# Patient Record
Sex: Female | Born: 1965 | Race: Black or African American | Hispanic: No | Smoking: Former smoker
Health system: Southern US, Community
[De-identification: ages and names within clinical notes are randomized; demographics above are authoritative.]

## PROBLEM LIST (undated history)

## (undated) DIAGNOSIS — K219 Gastro-esophageal reflux disease without esophagitis: Secondary | ICD-10-CM

## (undated) DIAGNOSIS — E079 Disorder of thyroid, unspecified: Secondary | ICD-10-CM

## (undated) DIAGNOSIS — I10 Essential (primary) hypertension: Secondary | ICD-10-CM

## (undated) DIAGNOSIS — E7800 Pure hypercholesterolemia, unspecified: Secondary | ICD-10-CM

## (undated) HISTORY — PX: TUBAL LIGATION: SHX77

## (undated) HISTORY — DX: Gastro-esophageal reflux disease without esophagitis: K21.9

## (undated) HISTORY — PX: DIAGNOSTIC LAPAROSCOPY: SUR761

---

## 2005-06-27 ENCOUNTER — Ambulatory Visit: Payer: Self-pay | Admitting: Family Medicine

## 2005-10-27 ENCOUNTER — Ambulatory Visit: Payer: Self-pay | Admitting: Family Medicine

## 2005-11-17 ENCOUNTER — Ambulatory Visit: Payer: Self-pay | Admitting: Family Medicine

## 2010-02-05 ENCOUNTER — Emergency Department (HOSPITAL_COMMUNITY): Admission: EM | Admit: 2010-02-05 | Discharge: 2010-02-05 | Payer: Self-pay | Admitting: Emergency Medicine

## 2010-10-28 ENCOUNTER — Ambulatory Visit (HOSPITAL_COMMUNITY)
Admission: RE | Admit: 2010-10-28 | Discharge: 2010-10-28 | Payer: Self-pay | Source: Home / Self Care | Attending: Obstetrics and Gynecology | Admitting: Obstetrics and Gynecology

## 2010-11-04 LAB — CBC
HCT: 37.1 % (ref 36.0–46.0)
Hemoglobin: 12.2 g/dL (ref 12.0–15.0)
MCH: 25.7 pg — ABNORMAL LOW (ref 26.0–34.0)
MCHC: 32.9 g/dL (ref 30.0–36.0)
MCV: 78.3 fL (ref 78.0–100.0)
Platelets: 315 10*3/uL (ref 150–400)
RBC: 4.74 MIL/uL (ref 3.87–5.11)
RDW: 13.8 % (ref 11.5–15.5)
WBC: 7.2 10*3/uL (ref 4.0–10.5)

## 2010-11-04 LAB — PREGNANCY, URINE: Preg Test, Ur: NEGATIVE

## 2010-11-11 NOTE — Op Note (Signed)
  NAMEALYRICA, Alexandria [name]            ACCOUNT NO.:  [id number]  MEDICAL RECORD NO.:  [id number]          PATIENT TYPE:  AMB  LOCATION:  SDC                           FACILITY:  [facility]  PHYSICIAN:  Avalynne Diver A. Shayley Medlin, M.D. DATE OF BIRTH:  11-03-1965  DATE OF PROCEDURE:  10/28/2010 DATE OF DISCHARGE:  10/28/2010                              OPERATIVE REPORT   PREOPERATIVE DIAGNOSIS:  Menometrorrhagia.  POSTOPERATIVE DIAGNOSIS:  Menometrorrhagia.  PROCEDURES:  D and C, hysteroscopy, ThermaChoice endometrial ablation.  SURGEON:  Afsa Meany A. Madeline Bebout, MD  ASSISTANT:  None.  ANESTHESIA:  General and local.  FINDINGS:  Small cavity, both ostia visualized.  No polyps or submucosal fibroids.  Endometrial curettings were sent to Pathology.  ESTIMATED BLOOD LOSS:  There was minimal estimated blood loss.  COMPLICATIONS:  None.  The patient to PACU in stable condition.  PROCEDURE IN DETAIL:  The patient taken to the operating room where she was given general anesthesia, placed in dorsal lithotomy position and prepped and draped in normal sterile fashion.  In-and-out catheter was used to drain the bladder.  A bivalve speculum was placed into the vagina.  The anterior lip of the cervix was grasped with single-tooth tenaculum.  A 20 mL of 1% Xylocaine was used for cervical block.  Cervix was dilated with Shawnie Pons dilators.  Patient kept pushing the speculum out with her breathing.  So we removed the speculum and used a weighted speculum in the vagina.  The hysteroscope was placed into the cavity. They were seen to be very small.  Both ostia were visualized with no submucosal fibroids or polyps seen.  The hysteroscope was then removed from the cavity.  A sharp curettage was done until a gritty texture was noted.  The ThermaChoice was done per protocol.  We did have to try and redo the ThermaChoice about 2-3 times secondary to overheating, on the third time, though it did not work, I did put in the  hysteroscope after the ablation and 99% of the fundus had been ablated without difficulty.  There was no areas that looked abnormal and no sign of perforation.  The deficit was 120 mL.  All instruments were correct. Sponge, lap, and needle counts were correct.  The patient went to recovery room in stable condition.     Raymon Schlarb A. Normand Sloop, M.D.     NAD/MEDQ  D:  10/28/2010  T:  10/29/2010  Job:  [id number]  Electronically Signed by Jaymes Graff M.D. on 11/11/2010 03:01:06 PM

## 2010-11-11 NOTE — H&P (Signed)
Alexandria Alexandria [name], Alexandria [name]            ACCOUNT NO.:  [id number]  MEDICAL RECORD NO.:  [id number]          PATIENT TYPE:  AMB  LOCATION:  SDC                           [facility]ILITY:  [facility]  PHYSICIAN:  Alexandria Alexandria [name], M.D. DATE OF BIRTH:  [birth year]  DATE OF ADMISSION: DATE OF DISCHARGE:                             HISTORY & PHYSICAL   DIAGNOSIS:  Menometrorrhagia.  HISTORY OF PRESENT ILLNESS:  The patient is a 45 year old African American female who presented to me in October 2011 complained of irregular cycles for a year, stated that she would get her menses 2 times a month every 14-20 days and they may last for 7 days with some spotting in between.  She would change a pad every hour, denied any chest pain or shortness of breath.  No bleeding disorders.  She did have some mild right lower quadrant pain with her menses.  Aleve took the pain, from a 7-8/10 to 0.  The patient had an ultrasound which was significant for uterus measured 8.66 x 5.57 x 5.64 and had a posterior fibroid measuring 6.31 x 5.95 with normal right ovary, left ovary had a simple cyst measuring 3.79 cm.  The patient then had a sonohysterogram which showed no masses and endometrial width of 2.46 and a length of 4.71.  Her free T3 and T4 were within normal limits.  Her hemoglobin is 12.6.  All treatments were reviewed with the patient for irregular vaginal bleeding, and she wanted to proceed with a D and C hysteroscopy and endometrial ablation.  PAST MEDICAL HISTORY:  As above.  PAST SURGICAL HISTORY:  Significant for tubal ligation.  She is currently not on any medications.  ALLERGIES:  She has no known drug allergies.  SOCIAL HISTORY:  She does smoke about a pack a day.  No alcohol or illicit drug use.  PHYSICAL EXAMINATION:  VITAL SIGNS:  The patient is 156 pounds.  She is 5 feet 8-1/2 inch, pulse is 76, blood pressure is 140/90. HEENT:  Pupils are equal.  Hearing is normal.  Throat is clear. NECK:  Thyroid is  not enlarged. HEART:  Regular rate and rhythm. LUNGS:  Clear to auscultation bilaterally. SKIN:  It has no masses, discharge, skin change, or nipple retraction bilaterally. BACK:  No CVA tenderness. ABDOMEN:  Nontender without any masses or organomegaly. MUSCULOSKELETAL:  No clubbing, cyanosis, or edema. NEUROLOGICAL:  Within normal limits. PELVIC:  Full vaginal exam within normal limits.  Cervix is nontender without any lesions.  Uterus is irregular, about 10-week size, mobile, nontender.  Adnexa has no masses.  Urine pregnancy test was negative. Her TSH was slightly elevated but free T3 and T4 were within normal limits.  REVIEW OF SYSTEMS:  HEMATOLOGY:  No bleeding disorder.  GENITOURINARY: Significant for irregular vaginal bleeding.  MUSCULOSKELETAL:  No weakness.  CARDIOVASCULAR:  No heart palpitations.  ASSESSMENT:  Irregular vaginal bleeding.  All treatments were reviewed with the patient, the hormone, uterine artery embolization, myomectomy, ablation, D and C with and without ablation, hysterectomy, and observation.  Patient decided D and C hysteroscopy, endometrial ablation.  She understands the risks are but not limited to bleeding,  infection, damage to internal organs such as bowel, bladder, and major blood vessels.     Alexandria Alexandria [name], M.D.     NAD/MEDQ  D:  10/27/2010  T:  10/28/2010  Job:  [id number]  Electronically Signed by Alexandria Alexandria [name] M.D. on 11/11/2010 03:00:59 PM

## 2011-01-07 LAB — POCT I-STAT, CHEM 8
BUN: 10 mg/dL (ref 6–23)
Calcium, Ion: 1.07 mmol/L — ABNORMAL LOW (ref 1.12–1.32)
Chloride: 106 mEq/L (ref 96–112)
Creatinine, Ser: 0.8 mg/dL (ref 0.4–1.2)
Glucose, Bld: 79 mg/dL (ref 70–99)
HCT: 41 % (ref 36.0–46.0)
Hemoglobin: 13.9 g/dL (ref 12.0–15.0)
Potassium: 3.4 mEq/L — ABNORMAL LOW (ref 3.5–5.1)
Sodium: 140 mEq/L (ref 135–145)
TCO2: 25 mmol/L (ref 0–100)

## 2011-01-07 LAB — POCT CARDIAC MARKERS
CKMB, poc: 1.1 ng/mL (ref 1.0–8.0)
Myoglobin, poc: 51.8 ng/mL (ref 12–200)
Troponin i, poc: <0.05 ng/mL (ref 0.00–0.09)

## 2011-10-27 ENCOUNTER — Other Ambulatory Visit: Payer: Self-pay | Admitting: Obstetrics and Gynecology

## 2011-11-11 ENCOUNTER — Encounter (HOSPITAL_COMMUNITY): Payer: Self-pay | Admitting: Pharmacist

## 2011-11-18 ENCOUNTER — Encounter (HOSPITAL_COMMUNITY)
Admission: RE | Admit: 2011-11-18 | Discharge: 2011-11-18 | Disposition: A | Discharge status: Home / Self Care | Payer: BC Managed Care – PPO | Source: Ambulatory Visit | Attending: Obstetrics and Gynecology | Admitting: Obstetrics and Gynecology

## 2011-11-18 ENCOUNTER — Encounter (HOSPITAL_COMMUNITY): Payer: Self-pay

## 2011-11-18 LAB — CBC
HCT: 42.7 % (ref 36.0–46.0)
Hemoglobin: 14 g/dL (ref 12.0–15.0)
MCH: 26.6 pg (ref 26.0–34.0)
MCHC: 32.8 g/dL (ref 30.0–36.0)
MCV: 81 fL (ref 78.0–100.0)
Platelets: 262 10*3/uL (ref 150–400)
RBC: 5.27 MIL/uL — ABNORMAL HIGH (ref 3.87–5.11)
RDW: 13.2 % (ref 11.5–15.5)
WBC: 6.5 10*3/uL (ref 4.0–10.5)

## 2011-11-18 LAB — SURGICAL PCR SCREEN
MRSA, PCR: NEGATIVE
Staphylococcus aureus: NEGATIVE

## 2011-11-18 NOTE — Patient Instructions (Addendum)
20 GLENYCE RANDLE  11/18/2011   Your procedure is scheduled on:  11/25/11  Enter through the Main Entrance of [facility] at 8 AM.  Pick up the phone at the desk and dial [phone].   Call this number if you have problems the morning of surgery: [phone]   Remember:   Do not eat food:After Midnight.  Do not drink clear liquids: After Midnight.  Take these medicines the morning of surgery with A SIP OF WATER: NA   Do not wear jewelry, make-up or nail polish.  Do not wear lotions, powders, or perfumes. You may wear deodorant.  Do not shave 48 hours prior to surgery.  Do not bring valuables to the hospital.  Contacts, dentures or bridgework may not be worn into surgery.  Leave suitcase in the car. After surgery it may be brought to your room.  For patients admitted to the hospital, checkout time is 11:00 AM the day of discharge.   Patients discharged the day of surgery will not be allowed to drive home.  Name and phone number of your driver: NA  Special Instructions: CHG Shower Use Special Wash: 1/2 bottle night before surgery and 1/2 bottle morning of surgery.   Please read over the following fact sheets that you were given: MRSA Information

## 2011-11-22 NOTE — H&P (Signed)
NAMECAMBRIE, Alexandria Little            ACCOUNT NO.:  [id number]  MEDICAL RECORD NO.:  [id number]  LOCATION:  SDC                           [facility]ACILITY:  [facility]  PHYSICIAN:  Naima A. Dillard, M.D. DATE OF BIRTH:  14-Apr-1966  DATE OF ADMISSION:  11/18/2011 DATE OF DISCHARGE:  11/18/2011                             HISTORY & PHYSICAL   HISTORY OF PRESENT ILLNESS:  Ms. Alexandria Little is a 46 year old single black female, para 2-0-1-2, presenting for a total laparoscopic hysterectomy and cystoscopy because of symptomatic uterine fibroids and chronic pelvic pain.  Two years ago, the patient complained of irregular vaginal bleeding that would occur every 14-20 days and last for approximately 7 days.  Additionally, she would experience intermenstrual bleeding along with severe cramping.  In January 2012, the patient underwent Thermachoice endometrial ablation that improved her bleeding pattern. However, the patient states that 1 week before and 2 days after her 4 day menstrual flow, she will experience severe cramping that she rates as a 10/10 on 10-point pain scale.  The patient's pain will cause her to double over.  It is described as being very sharp and being more intense on the right as opposed to the left portion of her pelvic region. Fortunately, the patient is able to find relief by taking naproxen. When she has her menstrual flow, she changes a pad every 2-3 hours and denies any intermenstrual bleeding or actual cramping during her period, any dyspareunia, urinary tract symptoms, changes in her bowel movements or vaginitis symptoms.  An pelvic ultrasound in December 2012, showed a uterus measuring 9.08 x 7.20 x 5.92 cm with a posterior submucosal fibroid (dimensions not mentioned) a normal right ovary and a simple left ovarian cyst measuring 3.6 x 2.7 x 2.3 cm.  There was no free fluid in the cul-de-sac and the left ovary itself measured 4.31 x 2.85 x 2.64 cm.  A review of both medical and surgical  management options were given to the patient, however, she desires to proceed with definitive therapy in the form of hysterectomy.  PAST MEDICAL HISTORY/OBSTETRIC HISTORY:  Gravida 3, para 2-0-1-2.  Both of the patient's deliveries were vaginal.  The largest weighing 6 pounds, 0 ounces, and she experienced gestational diabetes.  GYNECOLOGIC HISTORY:  Menarche at 46 years old.  Last menstrual period November 07, 2011.  She uses tubal ligation as her method of contraception.  Denies any history of sexually transmitted diseases or abnormal Pap smears.  MEDICAL HISTORY:  Negative.  SURGICAL HISTORY:  In 2002, bilateral tubal ligation, in 2012 Thermachoice endometrial ablation.  Denies any history of blood transfusions or problems with anesthesia.  FAMILY HISTORY:  Cardiovascular disease, hypertension and stroke.  SOCIAL HISTORY:  The patient is single and she works for [employer] as a Location manager.  HABITS:  She smokes 1 pack of cigarettes per day.  Denies any illicit drug use or alcohol consumption.  CURRENT MEDICATIONS:  None.  ALLERGIES:  She has no known drug allergies.  She further denies any sensitivities to soy, latex, peanuts or shellfish.  REVIEW OF SYSTEMS:  The patient wears glasses.  She has a partial plate in her bottom jaw.  Denies any headache with blurred vision, chest  pain, shortness of breath, nausea, vomiting, diarrhea, difficulty swallowing, urinary urgency, frequency, hesitancy or dysuria, any bright red blood per rectum, problems with constipation, back pain, myalgias, arthralgias, skin rashes and except as is mentioned in history of present illness, the patient's review of systems is otherwise negative.  PHYSICAL EXAMINATION:  VITAL SIGNS:  Blood pressure 122/80, pulse is 72, respiration 14, temperature 98.8 degrees Fahrenheit orally, weight is 161 pounds, height 5 feet, 1.5 inches tall, body mass index is 31. NECK:  Supple without masses.  There  is no thyromegaly or cervical adenopathy. HEART:  Regular rate and rhythm. LUNGS:  Clear. BACK:  No CVA tenderness. ABDOMEN:  No tenderness, guarding, rebound, palpable masses or organomegaly. EXTREMITIES:  No clubbing, cyanosis or edema. PELVIC:  EG/BUS is normal.  Vagina is normal.  Cervix is nontender without lesions.  Uterus appears 8-10 week size.  It is without tenderness.  Adnexa without tenderness or masses.  IMPRESSION: 1. Fibroid uterus 2. Pelvic pain.  DISPOSITION:  A discussion was held with the patient regarding the indications for her procedure along with its risks, which include, but are not limited to, reaction to anesthesia, damage to adjacent organs, infection, excessive bleeding and the possibility of the need for an open abdominal incision.  The patient further understands that her pain may not be relieved by this procedure and she is to complete a MiraLAX bowel prep 24 hours prior to surgery.  The patient verbalized understanding of these risks and has consented to proceed with a total laparoscopic hysterectomy with the possibility of a total abdominal Hysterectomy, or  possible laparoscopically-assisted vaginal hysterectomy Followed by cystoscopy at [facility] [place] on November 25, 2011, at 9:30 a.m.     Elexus Barman J. Lowell Guitar, P.A.-C   ______________________________ Pierre Bali Normand Sloop, M.D.    EJP/MEDQ  D:  11/21/2011  T:  11/22/2011  Job:  [id number]

## 2011-11-25 ENCOUNTER — Other Ambulatory Visit: Payer: Self-pay | Admitting: Obstetrics and Gynecology

## 2011-11-25 ENCOUNTER — Ambulatory Visit (HOSPITAL_COMMUNITY): Payer: BC Managed Care – PPO | Admitting: Anesthesiology

## 2011-11-25 ENCOUNTER — Encounter (HOSPITAL_COMMUNITY): Payer: Self-pay | Admitting: *Deleted

## 2011-11-25 ENCOUNTER — Encounter (HOSPITAL_COMMUNITY)
Admission: RE | Disposition: A | Discharge status: Home / Self Care | Payer: Self-pay | Source: Ambulatory Visit | Attending: Obstetrics and Gynecology

## 2011-11-25 ENCOUNTER — Encounter (HOSPITAL_COMMUNITY): Payer: Self-pay | Admitting: Anesthesiology

## 2011-11-25 ENCOUNTER — Ambulatory Visit (HOSPITAL_COMMUNITY)
Admission: RE | Admit: 2011-11-25 | Discharge: 2011-11-26 | Disposition: A | Discharge status: Home / Self Care | Payer: BC Managed Care – PPO | Source: Ambulatory Visit | Attending: Obstetrics and Gynecology | Admitting: Obstetrics and Gynecology

## 2011-11-25 DIAGNOSIS — Z90710 Acquired absence of both cervix and uterus: Secondary | ICD-10-CM

## 2011-11-25 DIAGNOSIS — Z01818 Encounter for other preprocedural examination: Secondary | ICD-10-CM | POA: Insufficient documentation

## 2011-11-25 DIAGNOSIS — D251 Intramural leiomyoma of uterus: Secondary | ICD-10-CM | POA: Insufficient documentation

## 2011-11-25 DIAGNOSIS — N949 Unspecified condition associated with female genital organs and menstrual cycle: Secondary | ICD-10-CM | POA: Insufficient documentation

## 2011-11-25 DIAGNOSIS — Z01812 Encounter for preprocedural laboratory examination: Secondary | ICD-10-CM | POA: Insufficient documentation

## 2011-11-25 DIAGNOSIS — D219 Benign neoplasm of connective and other soft tissue, unspecified: Secondary | ICD-10-CM

## 2011-11-25 HISTORY — PX: CYSTOSCOPY: SHX5120

## 2011-11-25 HISTORY — PX: LAPAROSCOPIC HYSTERECTOMY: SHX1926

## 2011-11-25 LAB — PREGNANCY, URINE: Preg Test, Ur: NEGATIVE

## 2011-11-25 SURGERY — HYSTERECTOMY, TOTAL, LAPAROSCOPIC
Anesthesia: General | Site: Abdomen | Wound class: Clean Contaminated

## 2011-11-25 MED ORDER — ESTRADIOL 0.1 MG/GM VA CREA
TOPICAL_CREAM | VAGINAL | Status: AC
  Filled 2011-11-25: qty 42.5

## 2011-11-25 MED ORDER — NALOXONE HCL 0.4 MG/ML IJ SOLN: 0.4000 mg | INTRAMUSCULAR | Status: DC | PRN

## 2011-11-25 MED ORDER — HYDROMORPHONE HCL PF 1 MG/ML IJ SOLN
INTRAMUSCULAR | Status: AC
  Filled 2011-11-25: qty 1

## 2011-11-25 MED ORDER — GLYCOPYRROLATE 0.2 MG/ML IJ SOLN
INTRAMUSCULAR | Status: AC
  Filled 2011-11-25: qty 2

## 2011-11-25 MED ORDER — MIDAZOLAM HCL 2 MG/2ML IJ SOLN
INTRAMUSCULAR | Status: AC
  Filled 2011-11-25: qty 2

## 2011-11-25 MED ORDER — HYDROMORPHONE HCL PF 1 MG/ML IJ SOLN
INTRAMUSCULAR | Status: DC | PRN
  Administered 2011-11-25: 1 mg via INTRAVENOUS

## 2011-11-25 MED ORDER — INDIGOTINDISULFONATE SODIUM 8 MG/ML IJ SOLN
INTRAMUSCULAR | Status: AC
  Filled 2011-11-25: qty 5

## 2011-11-25 MED ORDER — ONDANSETRON HCL 4 MG/2ML IJ SOLN
INTRAMUSCULAR | Status: DC | PRN
  Administered 2011-11-25: 4 mg via INTRAVENOUS

## 2011-11-25 MED ORDER — PROPOFOL 10 MG/ML IV EMUL
INTRAVENOUS | Status: DC | PRN
  Administered 2011-11-25: 50 mg via INTRAVENOUS
  Administered 2011-11-25: 150 mg via INTRAVENOUS

## 2011-11-25 MED ORDER — MENTHOL 3 MG MT LOZG: 1.0000 | LOZENGE | OROMUCOSAL | Status: DC | PRN

## 2011-11-25 MED ORDER — DIPHENHYDRAMINE HCL 12.5 MG/5ML PO ELIX: 12.5000 mg | ORAL_SOLUTION | Freq: Four times a day (QID) | ORAL | Status: DC | PRN

## 2011-11-25 MED ORDER — DEXAMETHASONE SODIUM PHOSPHATE 10 MG/ML IJ SOLN
INTRAMUSCULAR | Status: DC | PRN
  Administered 2011-11-25: 10 mg via INTRAVENOUS

## 2011-11-25 MED ORDER — 0.9 % SODIUM CHLORIDE (POUR BTL) OPTIME
TOPICAL | Status: DC | PRN
  Administered 2011-11-25: 1000 mL

## 2011-11-25 MED ORDER — NEOSTIGMINE METHYLSULFATE 1 MG/ML IJ SOLN
INTRAMUSCULAR | Status: DC | PRN
  Administered 2011-11-25: 3 mg via INTRAVENOUS

## 2011-11-25 MED ORDER — HYDROCODONE-ACETAMINOPHEN 5-325 MG PO TABS: 1.0000 | ORAL_TABLET | ORAL | Status: DC | PRN

## 2011-11-25 MED ORDER — ACETAMINOPHEN 325 MG PO TABS: 325.0000 mg | ORAL_TABLET | ORAL | Status: DC | PRN

## 2011-11-25 MED ORDER — PROPOFOL 10 MG/ML IV EMUL
INTRAVENOUS | Status: AC
  Filled 2011-11-25: qty 20

## 2011-11-25 MED ORDER — NEOSTIGMINE METHYLSULFATE 1 MG/ML IJ SOLN
INTRAMUSCULAR | Status: AC
  Filled 2011-11-25: qty 10

## 2011-11-25 MED ORDER — CEFAZOLIN SODIUM 1-5 GM-% IV SOLN
1.0000 g | INTRAVENOUS | Status: AC
  Administered 2011-11-25: 1 g via INTRAVENOUS

## 2011-11-25 MED ORDER — IBUPROFEN 600 MG PO TABS
600.0000 mg | ORAL_TABLET | Freq: Four times a day (QID) | ORAL | Status: DC | PRN
  Administered 2011-11-26: 600 mg via ORAL
  Filled 2011-11-25: qty 1

## 2011-11-25 MED ORDER — SODIUM CHLORIDE 0.9 % IJ SOLN: 9.0000 mL | INTRAMUSCULAR | Status: DC | PRN

## 2011-11-25 MED ORDER — LACTATED RINGERS IR SOLN
Status: DC | PRN
  Administered 2011-11-25: 3000 mL

## 2011-11-25 MED ORDER — ONDANSETRON HCL 4 MG/2ML IJ SOLN: 4.0000 mg | Freq: Four times a day (QID) | INTRAMUSCULAR | Status: DC | PRN

## 2011-11-25 MED ORDER — LIDOCAINE HCL (CARDIAC) 20 MG/ML IV SOLN
INTRAVENOUS | Status: DC | PRN
  Administered 2011-11-25: 50 mg via INTRAVENOUS

## 2011-11-25 MED ORDER — LIDOCAINE HCL (CARDIAC) 20 MG/ML IV SOLN
INTRAVENOUS | Status: AC
  Filled 2011-11-25: qty 5

## 2011-11-25 MED ORDER — INDIGOTINDISULFONATE SODIUM 8 MG/ML IJ SOLN
INTRAMUSCULAR | Status: DC | PRN
  Administered 2011-11-25: 5 mL via INTRAVENOUS

## 2011-11-25 MED ORDER — PROMETHAZINE HCL 25 MG/ML IJ SOLN: 6.2500 mg | INTRAMUSCULAR | Status: DC | PRN

## 2011-11-25 MED ORDER — HYDROMORPHONE 0.3 MG/ML IV SOLN
INTRAVENOUS | Status: DC
  Administered 2011-11-25: 16:00:00 via INTRAVENOUS
  Administered 2011-11-25: 0.3 mg via INTRAVENOUS

## 2011-11-25 MED ORDER — ROCURONIUM BROMIDE 50 MG/5ML IV SOLN
INTRAVENOUS | Status: AC
  Filled 2011-11-25: qty 1

## 2011-11-25 MED ORDER — KETOROLAC TROMETHAMINE 30 MG/ML IJ SOLN
INTRAMUSCULAR | Status: DC | PRN
  Administered 2011-11-25: 30 mg via INTRAVENOUS

## 2011-11-25 MED ORDER — KETOROLAC TROMETHAMINE 30 MG/ML IJ SOLN
30.0000 mg | Freq: Four times a day (QID) | INTRAMUSCULAR | Status: DC
  Administered 2011-11-25 – 2011-11-26 (×3): 30 mg via INTRAVENOUS
  Filled 2011-11-25 (×3): qty 1

## 2011-11-25 MED ORDER — ROCURONIUM BROMIDE 100 MG/10ML IV SOLN
INTRAVENOUS | Status: DC | PRN
  Administered 2011-11-25: 30 mg via INTRAVENOUS
  Administered 2011-11-25 (×3): 10 mg via INTRAVENOUS

## 2011-11-25 MED ORDER — GLYCOPYRROLATE 0.2 MG/ML IJ SOLN
INTRAMUSCULAR | Status: AC
  Filled 2011-11-25: qty 3

## 2011-11-25 MED ORDER — PROMETHAZINE HCL 25 MG/ML IJ SOLN: 12.5000 mg | INTRAMUSCULAR | Status: DC | PRN

## 2011-11-25 MED ORDER — GLYCOPYRROLATE 0.2 MG/ML IJ SOLN
INTRAMUSCULAR | Status: DC | PRN
  Administered 2011-11-25: .6 mg via INTRAVENOUS

## 2011-11-25 MED ORDER — STERILE WATER FOR IRRIGATION IR SOLN
Status: DC | PRN
  Administered 2011-11-25: 1000 mL via INTRAVESICAL

## 2011-11-25 MED ORDER — BUPIVACAINE HCL (PF) 0.25 % IJ SOLN
INTRAMUSCULAR | Status: AC
  Filled 2011-11-25: qty 30

## 2011-11-25 MED ORDER — DIPHENHYDRAMINE HCL 50 MG/ML IJ SOLN: 12.5000 mg | Freq: Four times a day (QID) | INTRAMUSCULAR | Status: DC | PRN

## 2011-11-25 MED ORDER — ZOLPIDEM TARTRATE 5 MG PO TABS: 5.0000 mg | ORAL_TABLET | Freq: Every evening | ORAL | Status: DC | PRN

## 2011-11-25 MED ORDER — DEXAMETHASONE SODIUM PHOSPHATE 10 MG/ML IJ SOLN
INTRAMUSCULAR | Status: AC
  Filled 2011-11-25: qty 1

## 2011-11-25 MED ORDER — FENTANYL CITRATE 0.05 MG/ML IJ SOLN
INTRAMUSCULAR | Status: AC
  Filled 2011-11-25: qty 5

## 2011-11-25 MED ORDER — HYDROMORPHONE 0.3 MG/ML IV SOLN
INTRAVENOUS | Status: AC
  Filled 2011-11-25: qty 25

## 2011-11-25 MED ORDER — FENTANYL CITRATE 0.05 MG/ML IJ SOLN
INTRAMUSCULAR | Status: DC | PRN
  Administered 2011-11-25: 100 ug via INTRAVENOUS
  Administered 2011-11-25: 150 ug via INTRAVENOUS
  Administered 2011-11-25 (×3): 50 ug via INTRAVENOUS
  Administered 2011-11-25: 100 ug via INTRAVENOUS

## 2011-11-25 MED ORDER — FENTANYL CITRATE 0.05 MG/ML IJ SOLN: 25.0000 ug | INTRAMUSCULAR | Status: DC | PRN

## 2011-11-25 MED ORDER — MIDAZOLAM HCL 5 MG/5ML IJ SOLN
INTRAMUSCULAR | Status: DC | PRN
  Administered 2011-11-25: 2 mg via INTRAVENOUS

## 2011-11-25 MED ORDER — KETOROLAC TROMETHAMINE 30 MG/ML IJ SOLN
INTRAMUSCULAR | Status: AC
  Filled 2011-11-25: qty 1

## 2011-11-25 MED ORDER — KETOROLAC TROMETHAMINE 30 MG/ML IJ SOLN: 30.0000 mg | Freq: Four times a day (QID) | INTRAMUSCULAR | Status: DC

## 2011-11-25 MED ORDER — ONDANSETRON HCL 4 MG/2ML IJ SOLN
INTRAMUSCULAR | Status: AC
  Filled 2011-11-25: qty 2

## 2011-11-25 MED ORDER — ESTRADIOL 0.1 MG/GM VA CREA
TOPICAL_CREAM | VAGINAL | Status: DC | PRN
  Administered 2011-11-25: 1 via VAGINAL

## 2011-11-25 MED ORDER — LACTATED RINGERS IV SOLN
INTRAVENOUS | Status: DC
  Administered 2011-11-25 (×3): via INTRAVENOUS
  Administered 2011-11-25: 125 mL/h via INTRAVENOUS

## 2011-11-25 MED ORDER — BUPIVACAINE HCL (PF) 0.25 % IJ SOLN
INTRAMUSCULAR | Status: DC | PRN
  Administered 2011-11-25: 15 mL

## 2011-11-25 MED ORDER — KETOROLAC TROMETHAMINE 30 MG/ML IJ SOLN: 15.0000 mg | Freq: Once | INTRAMUSCULAR | Status: DC | PRN

## 2011-11-25 SURGICAL SUPPLY — 49 items
BARRIER ADHS 3X4 INTERCEED (GAUZE/BANDAGES/DRESSINGS) IMPLANT
CHLORAPREP W/TINT 26ML (MISCELLANEOUS) ×3 IMPLANT
CLOTH BEACON ORANGE TIMEOUT ST (SAFETY) ×3 IMPLANT
COVER MAYO STAND STRL (DRAPES) ×3 IMPLANT
DERMABOND ADVANCED (GAUZE/BANDAGES/DRESSINGS) ×1
DERMABOND ADVANCED .7 DNX12 (GAUZE/BANDAGES/DRESSINGS) ×2 IMPLANT
DISSECTOR BLUNT TIP ENDO 5MM (MISCELLANEOUS) IMPLANT
DRAPE CAMERA CLOSED 9X96 (DRAPES) IMPLANT
EVACUATOR SMOKE 8.L (FILTER) ×3 IMPLANT
GAUZE PACKING 1 X5 YD ST (GAUZE/BANDAGES/DRESSINGS) ×3 IMPLANT
GLOVE BIO SURGEON STRL SZ 6.5 (GLOVE) ×21 IMPLANT
GLOVE BIO SURGEON STRL SZ7.5 (GLOVE) ×9 IMPLANT
GLOVE BIOGEL PI IND STRL 7.0 (GLOVE) ×8 IMPLANT
GLOVE BIOGEL PI IND STRL 7.5 (GLOVE) ×4 IMPLANT
GLOVE BIOGEL PI INDICATOR 7.0 (GLOVE) ×4
GLOVE BIOGEL PI INDICATOR 7.5 (GLOVE) ×2
GLOVE ECLIPSE 6.5 STRL STRAW (GLOVE) ×12 IMPLANT
GLOVE SURG SS PI 7.0 STRL IVOR (GLOVE) ×3 IMPLANT
GOWN PREVENTION PLUS LG XLONG (DISPOSABLE) ×3 IMPLANT
GOWN STRL REIN XL XLG (GOWN DISPOSABLE) ×6 IMPLANT
HEMOSTAT SURGICEL 2X14 (HEMOSTASIS) ×3 IMPLANT
NS IRRIG 1000ML POUR BTL (IV SOLUTION) ×3 IMPLANT
OCCLUDER COLPOPNEUMO (BALLOONS) ×3 IMPLANT
PACK LAPAROSCOPY BASIN (CUSTOM PROCEDURE TRAY) ×3 IMPLANT
SCALPEL HARMONIC ACE (MISCELLANEOUS) ×3 IMPLANT
SCISSORS LAP 5X35 DISP (ENDOMECHANICALS) IMPLANT
SET CYSTO W/LG BORE CLAMP LF (SET/KITS/TRAYS/PACK) IMPLANT
SET IRRIG TUBING LAPAROSCOPIC (IRRIGATION / IRRIGATOR) ×3 IMPLANT
SLEEVE Z-THREAD 5X100MM (TROCAR) ×3 IMPLANT
SOLUTION ELECTROLUBE (MISCELLANEOUS) ×3 IMPLANT
SPONGE LAP 18X18 X RAY DECT (DISPOSABLE) ×3 IMPLANT
STRIP CLOSURE SKIN 1/4X4 (GAUZE/BANDAGES/DRESSINGS) IMPLANT
SUT MNCRL AB 3-0 PS2 27 (SUTURE) ×3 IMPLANT
SUT PDS AB 1 CT1 36 (SUTURE) ×21 IMPLANT
SUT VICRYL 0 TIES 12 18 (SUTURE) ×3 IMPLANT
SUT VICRYL 0 UR6 27IN ABS (SUTURE) ×6 IMPLANT
SYR 50ML LL SCALE MARK (SYRINGE) ×3 IMPLANT
TIP UTERINE 5.1X6CM LAV DISP (MISCELLANEOUS) IMPLANT
TIP UTERINE 6.7X10CM GRN DISP (MISCELLANEOUS) IMPLANT
TIP UTERINE 6.7X6CM WHT DISP (MISCELLANEOUS) IMPLANT
TIP UTERINE 6.7X8CM BLUE DISP (MISCELLANEOUS) ×3 IMPLANT
TOWEL OR 17X24 6PK STRL BLUE (TOWEL DISPOSABLE) ×6 IMPLANT
TRAY FOLEY CATH 14FR (SET/KITS/TRAYS/PACK) ×3 IMPLANT
TROCAR BALLN 12MMX100 BLUNT (TROCAR) ×3 IMPLANT
TROCAR Z-THREAD FIOS 11X100 BL (TROCAR) ×6 IMPLANT
TROCAR Z-THREAD FIOS 5X100MM (TROCAR) ×3 IMPLANT
TUBING FILTER THERMOFLATOR (ELECTROSURGICAL) ×3 IMPLANT
WARMER LAPAROSCOPE (MISCELLANEOUS) ×3 IMPLANT
WATER STERILE IRR 1000ML POUR (IV SOLUTION) ×3 IMPLANT

## 2011-11-25 NOTE — Op Note (Signed)
Preop Diagnosis: Symptomatic Fibroids, Pelvic Pain   Postop Diagnosis: Symptomatic Fibroids, Pelvic Pain   Procedure: HYSTERECTOMY TOTAL LAPAROSCOPIC CYSTOSCOPY and LOA  Anesthesia: General   Anesthesiologist: Velna Hatchet, MD   Attending: Michael Litter, MD   Assistant: Osborn Coho MD  Findings:10 week size uterus with fibroids.  Omental adhesions to the abdominal wall.  Normal appearing ovaries bilaterally  Pathology:uterus and cervix Fluids:  UOP:250 cc  EBL:175cc Complications:none  Procedure: The patient was taken to the operating room, placed under general anesthesia and prepped and draped in the normal sterile fashion. A Foley catheter was placed. The uterus sounded to 9cm. A weighted speculum and vaginal retractors were placed in the vagina. Tenaculum was placed on the anterior lip of the cervix. The uterus did sound to 8cm  A size 8cm tip was used and the rumi was placed, tip balloon and occluder insufflated. Attention was then turned to the abdomen. A 10 mm infraumbilical incision was made with the scalpel after 5 cc of 25% percent Marcaine was used for local anesthesia. The subcutaneous tissue was dissected and the fascia was incised with the knife. A purse string stitch was placed in the fascia and Hassan placed into the intra-abdominal cavity and anchored to the suture. Intraabdominal placement was confirmed with the laparoscope.  A 5 mm trochar was placed in the right  lower quadrant under direct visualization with the laparoscope. A 10 mm trochar was placed in the left lower quadrant under direct visualization.   The harmonic scalpel was used to cauterize and cut the uterine ovarian ligaments bilaterally.   Both round ligaments were cauterized and cut with the harmonic scalpel as well and the bladder flap created with the harmonic scalpel and removed away from the uterus. Both uterine arteries were cauterized and cut with harmonic scalpel.  The harmonic was used to  circumscribe the [facility] ring and free the uterus and cervix. The uterus was then pulled into the vagina. A 5mm trochar was placed in the mid abdominal quadrant.   Both angles were sutured with 1 PDS.  The  remainder of the cuff was sutured with 1 PDS using interrupted stitches until the vaginal cuff was closed.  A total of 6 sutures were used.   Irrigation was performed.  Gas was allowed to leave the abdomen to check for bleeders and all pedicles were seen to be hemostatic the patient was given indigo carmine.  Cystoscopy was performed and both ureters were seen to efflux indigo carmine without difficulty. The bladder had full integrity with no suture or laceration visualized.  The vagina was inspected and the cuff was noted to be intact.  Attention was then turned back to the abdomen after removing top pair of gloves. The abdomen was reinsufflated with CO2 gas.   The abdomen and pelvis was copiously irrigated and there was some oozing from the peritoneum in LLQ.  Surgicel was placed and   hemostasis was noted.  The 10 mm port on the patients left side was closed with 0 Vicryl using the fascial closure device.  All trochars were removed under direct visualization using the laparoscope.  The umbilical fascia was reapproximated by tying the circumferential suture. The two 10 mm incisions were closed with 3-0 Monocryl via a subcuticular stitch.  All remaining skin incisions were closed with Dermabond and the 10 mm skin incisions were reinforced using Dermabond.  Sponge lap and needle counts were correct. There was an interruption in the mucosa of the vagina.  This was reapproximated with an o vicryl figure of eight stitch.    The patient tolerated the procedure well and was returned to the PACU in stable condition

## 2011-11-25 NOTE — Progress Notes (Signed)
Subjective: Patient reports incisional pain, tolerating PO, + flatus, + BM and no problems voiding.    Objective: I have reviewed patient's vital signs, intake and output, medications, labs, microbiology, pathology and radiology results.  General: alert Resp: clear to auscultation bilaterally Cardio: regular rate and rhythm, S1, S2 normal, no murmur, click, rub or gallop GI: soft, non-tender; bowel sounds normal; no masses,  no organomegaly Extremities: extremities normal, atraumatic, no cyanosis or edema Vaginal Bleeding: minimal.  Scant old blood on the pad   Assessment/Plan: S/PTLH Cystoscopy.  Pt stable routine care  LOS: 0 days    Alexandria Little A 11/25/2011, 10:50 PM

## 2011-11-25 NOTE — Anesthesia Postprocedure Evaluation (Signed)
Anesthesia Post Note  Patient: Alexandria Little  Procedure(s) Performed:  HYSTERECTOMY TOTAL LAPAROSCOPIC; CYSTOSCOPY  Anesthesia type: General  Patient location: PACU  Post pain: Pain level controlled  Post assessment: Post-op Vital signs reviewed  Last Vitals:  Filed Vitals:   11/25/11 0836  BP: 152/93  Pulse: 72  Temp: 37 C  Resp: 18    Post vital signs: Reviewed  Level of consciousness: sedated  Complications: No apparent anesthesia complicationsfj

## 2011-11-25 NOTE — Anesthesia Preprocedure Evaluation (Addendum)
Anesthesia Evaluation  Patient identified by MRN, date of birth, ID band Patient awake    Reviewed: Allergy & Precautions, H&P , Patient's Chart, lab work & pertinent test results, reviewed documented beta blocker date and time   History of Anesthesia Complications Negative for: history of anesthetic complications  Airway Mallampati: II TM Distance: >3 FB Neck ROM: full    Dental No notable dental hx.    Pulmonary neg pulmonary ROS,  clear to auscultation  Pulmonary exam normal       Cardiovascular Exercise Tolerance: Good neg cardio ROS regular Normal    Neuro/Psych Negative Neurological ROS  Negative Psych ROS   GI/Hepatic negative GI ROS, Neg liver ROS,   Endo/Other  Negative Endocrine ROS  Renal/GU negative Renal ROS     Musculoskeletal   Abdominal   Peds  Hematology negative hematology ROS (+)   Anesthesia Other Findings   Reproductive/Obstetrics negative OB ROS                           Anesthesia Physical Anesthesia Plan  ASA: I  Anesthesia Plan: General ETT   Post-op Pain Management:    Induction:   Airway Management Planned:   Additional Equipment:   Intra-op Plan:   Post-operative Plan:   Informed Consent: I have reviewed the patients History and Physical, chart, labs and discussed the procedure including the risks, benefits and alternatives for the proposed anesthesia with the patient or authorized representative who has indicated his/her understanding and acceptance.   Dental Advisory Given  Plan Discussed with: CRNA, Surgeon and Anesthesiologist  Anesthesia Plan Comments:        Anesthesia Quick Evaluation

## 2011-11-25 NOTE — Transfer of Care (Signed)
Immediate Anesthesia Transfer of Care Note  Patient: Alexandria Little  Procedure(s) Performed:  HYSTERECTOMY TOTAL LAPAROSCOPIC; CYSTOSCOPY  Patient Location: PACU  Anesthesia Type: General  Level of Consciousness: alert   Airway & Oxygen Therapy: Patient Spontanous Breathing and Patient connected to nasal cannula oxygen  Post-op Assessment: Report given to PACU RN and Post -op Vital signs reviewed and stable  Post vital signs: stable  Complications: No apparent anesthesia complications

## 2011-11-26 ENCOUNTER — Encounter (HOSPITAL_COMMUNITY): Payer: Self-pay | Admitting: Obstetrics and Gynecology

## 2011-11-26 DIAGNOSIS — Z90710 Acquired absence of both cervix and uterus: Secondary | ICD-10-CM

## 2011-11-26 LAB — CBC
HCT: 36.6 % (ref 36.0–46.0)
Hemoglobin: 11.9 g/dL — ABNORMAL LOW (ref 12.0–15.0)
MCHC: 32.5 g/dL (ref 30.0–36.0)
MCV: 80.6 fL (ref 78.0–100.0)
RDW: 13.3 % (ref 11.5–15.5)

## 2011-11-26 LAB — BASIC METABOLIC PANEL
BUN: 11 mg/dL (ref 6–23)
Creatinine, Ser: 0.78 mg/dL (ref 0.50–1.10)
GFR calc Af Amer: >90 mL/min (ref >90)
GFR calc non Af Amer: >90 mL/min (ref >90)
Glucose, Bld: 129 mg/dL — ABNORMAL HIGH (ref 70–99)
Potassium: 3.6 mEq/L (ref 3.5–5.1)

## 2011-11-26 MED ORDER — SIMETHICONE 80 MG PO CHEW
80.0000 mg | CHEWABLE_TABLET | Freq: Four times a day (QID) | ORAL | Status: DC | PRN
  Administered 2011-11-26: 80 mg via ORAL

## 2011-11-26 MED ORDER — PROMETHAZINE HCL 12.5 MG PO TABS: 12.5000 mg | ORAL_TABLET | Freq: Four times a day (QID) | ORAL | Status: AC | PRN

## 2011-11-26 MED ORDER — HYDROCODONE-ACETAMINOPHEN 5-325 MG PO TABS: 1.0000 | ORAL_TABLET | Freq: Four times a day (QID) | ORAL | Status: AC | PRN

## 2011-11-26 MED ORDER — IBUPROFEN 600 MG PO TABS: 600.0000 mg | ORAL_TABLET | Freq: Four times a day (QID) | ORAL | Status: AC | PRN

## 2011-11-26 NOTE — Progress Notes (Signed)
1 Day Post-Op Procedure(s): HYSTERECTOMY TOTAL LAPAROSCOPIC CYSTOSCOPY  Subjective: Patient reports + flatus.    Objective: BP 111/73  Pulse 75  Temp(Src) 99 F (37.2 C) (Oral)  Resp 18  Ht 5\' 1"  (1.549 m)  Wt 73.483 kg (162 lb)  BMI 30.61 kg/m2  SpO2 97%  LMP 11/07/2011  General: alert Resp: clear to auscultation bilaterally Cardio: regular rate and rhythm, S1, S2 normal, no murmur, click, rub or gallop GI: soft, non-tender; bowel sounds normal; no masses,  no organomegaly and incision: clean, dry and intact Extremities: extremities normal, atraumatic, no cyanosis or edema vaginal packing removed and saturated with serosanguinous fluid  Assessment: s/p Procedure(s): HYSTERECTOMY TOTAL LAPAROSCOPIC CYSTOSCOPY: stable, tolerating diet and pt with gas pain  Plan: Advance diet Advance to PO medication d/c foley and if pt doing well plan dc home  LOS: 1 day    Alexandria Little A 11/26/2011, 11:02 AM

## 2011-11-26 NOTE — Progress Notes (Signed)
Teaching complete  Pt out in wheelchair  

## 2011-11-26 NOTE — Discharge Summary (Signed)
  Physician Discharge Summary  Patient ID: Alexandria Little MRN: [id number] DOB/AGE: 06/09/1966 46 y.o.  Admit date: 11/25/2011 Discharge date: 11/26/2011  Admission Diagnoses: syptomatic fibroids  Discharge Diagnoses:  Same  Discharged Condition: stable  Hospital Course: pt underwent TLH cystoscopy.  She voided and is tolerating normal diet.  She has remained afebrile with stable vital signs  Consults: none  Significant Diagnostic Studies: fasting glucose was elevated.  Will monitor  Treatments: IV hydration and surgery:   Discharge Exam: Blood pressure 111/73, pulse 75, temperature 99 F (37.2 C), temperature source Oral, resp. rate 18, height 5\' 1"  (1.549 m), weight 73.483 kg (162 lb), last menstrual period 11/07/2011, SpO2 97.00%. General appearance: alert Head: Normocephalic, without obvious abnormality Neck: no adenopathy, no carotid bruit, no JVD, supple, symmetrical, trachea midline and thyroid not enlarged, symmetric, no tenderness/mass/nodules Resp: clear to auscultation bilaterally Cardio: regular rate and rhythm, S1, S2 normal, no murmur, click, rub or gallop GI: soft, non-tender; bowel sounds normal; no masses,  no organomegaly Pelvic: scant blood on the pad.   Skin: Skin color, texture, turgor normal. No rashes or lesions Incision/Wound:incisions clean dry and intact  Disposition: stable  Discharge Orders    Future Orders Please Complete By Expires   Diet general      Discharge instructions      Comments:   Call for vaginal bleeding if you soak greater than pad per hour   Increase activity slowly      May shower / Bathe      Sexual Activity Restrictions      Comments:   Pelvic rest.  Nothing in the vagina   Call MD for:  temperature >100.4      Call MD for:  severe uncontrolled pain      Call MD for:  persistant dizziness or light-headedness        Medication List  As of 11/26/2011  1:27 PM   STOP taking these medications         naproxen sodium 550 MG  tablet         TAKE these medications         aspirin 325 MG tablet   Take 325 mg by mouth daily as needed. For pain      HYDROcodone-acetaminophen 5-325 MG per tablet   Commonly known as: NORCO   Take 1-2 tablets by mouth every 6 (six) hours as needed for pain.      ibuprofen 600 MG tablet   Commonly known as: ADVIL,MOTRIN   Take 1 tablet (600 mg total) by mouth every 6 (six) hours as needed for pain (mild pain).      promethazine 12.5 MG tablet   Commonly known as: PHENERGAN   Take 1 tablet (12.5 mg total) by mouth every 6 (six) hours as needed for nausea.           @FOLLOWUP6weeks  DC instructions given verbally and written    Signed: Dorene Bruni A 11/26/2011, 1:27 PM

## 2012-01-06 ENCOUNTER — Encounter: Payer: Self-pay | Admitting: Obstetrics and Gynecology

## 2012-01-12 ENCOUNTER — Encounter (INDEPENDENT_AMBULATORY_CARE_PROVIDER_SITE_OTHER): Payer: BC Managed Care – PPO | Admitting: Obstetrics and Gynecology

## 2012-01-12 DIAGNOSIS — Z9889 Other specified postprocedural states: Secondary | ICD-10-CM

## 2012-09-01 ENCOUNTER — Ambulatory Visit: Payer: BC Managed Care – PPO | Admitting: Obstetrics and Gynecology

## 2012-11-02 ENCOUNTER — Ambulatory Visit: Payer: BC Managed Care – PPO | Admitting: Obstetrics and Gynecology

## 2012-11-11 ENCOUNTER — Ambulatory Visit: Payer: BC Managed Care – PPO | Admitting: Obstetrics and Gynecology

## 2012-11-11 ENCOUNTER — Encounter: Payer: Self-pay | Admitting: Obstetrics and Gynecology

## 2012-11-11 VITALS — BP 118/84 | Ht 60.0 in | Wt 161.0 lb

## 2012-11-11 DIAGNOSIS — Z1231 Encounter for screening mammogram for malignant neoplasm of breast: Secondary | ICD-10-CM

## 2012-11-11 DIAGNOSIS — R350 Frequency of micturition: Secondary | ICD-10-CM

## 2012-11-11 DIAGNOSIS — Z124 Encounter for screening for malignant neoplasm of cervix: Secondary | ICD-10-CM

## 2012-11-11 NOTE — Progress Notes (Signed)
Last Pap: 08/22/2011 WNL: Yes Regular Periods: Hysterectomy Contraception: None  Monthly Breast exam:no Tetanus<5yrs:no Nl.Bladder Function:yes Daily BMs:yes Healthy Diet:no Calcium:no Mammogram:yes Date of Mammogram: per pt 02/2011 @ [facility]  Exercise:no Have often Exercise: none  Seatbelt: yes Abuse at home: no Stressful work:no Sigmoid-colonoscopy: n/a Bone Density: Yes per pt 02/2011 PCP: None Change in PMH: none  Change in ZOX:WRUE  BP 118/84  Ht 5' (1.524 m)  Wt 161 lb (73.029 kg)  BMI 31.44 kg/m2  LMP 11/07/2011 Pt with complaints:no Physical Examination: General appearance - alert, well appearing, and in no distress Mental status - normal mood, behavior, speech, dress, motor activity, and thought processes Neck - supple, no significant adenopathy,  thyroid exam: thyroid is normal in size without nodules or tenderness Chest - clear to auscultation, no wheezes, rales or rhonchi, symmetric air entry Heart - normal rate and regular rhythm Abdomen - soft, nontender, nondistended, no masses or organomegaly Breasts - breasts appear normal, no suspicious masses, no skin or nipple changes or axillary nodes Pelvic - normal external genitalia, vulva, vagina, cervix, uterus and adnexa Rectal - rectal exam not indicated Back exam - full range of motion, no tenderness, palpable spasm or pain on motion Neurological - alert, oriented, normal speech, no focal findings or movement disorder noted Musculoskeletal - no joint tenderness, deformity or swelling Extremities - no edema, redness or tenderness in the calves or thighs Skin - normal coloration and turgor, no rashes, no suspicious skin lesions noted Routine exam Pap sent yes Mammogram due yes will schedule hys used for contraception RT 1 yr

## 2012-11-11 NOTE — Addendum Note (Signed)
Addended by: Rolla Plate on: 11/11/2012 11:06 AM   Modules accepted: Orders

## 2012-11-12 ENCOUNTER — Telehealth: Payer: Self-pay | Admitting: Obstetrics and Gynecology

## 2012-11-12 LAB — PAP IG W/ RFLX HPV ASCU

## 2012-11-18 ENCOUNTER — Encounter: Payer: Self-pay | Admitting: Obstetrics and Gynecology

## 2012-11-18 ENCOUNTER — Telehealth: Payer: Self-pay

## 2012-11-18 NOTE — Telephone Encounter (Signed)
Spoke with pt informed pap results informed trich seen on pap need ov for wet prep to confirm pt has appt 11/23/12 at 9:45 with nd pt voice understanding

## 2012-11-18 NOTE — Telephone Encounter (Signed)
Message copied by Rolla Plate on Thu Nov 18, 2012  8:26 AM ------      Message from: Jaymes Graff      Created: Wed Nov 17, 2012  9:22 PM       Please tell the patient that Trichomonas was seen on her pap. I would like her to make an appointment to have a wet prep to confirm if trichomonas is truly present.

## 2012-11-23 ENCOUNTER — Ambulatory Visit: Payer: BC Managed Care – PPO | Admitting: Obstetrics and Gynecology

## 2012-11-23 ENCOUNTER — Encounter: Payer: Self-pay | Admitting: Obstetrics and Gynecology

## 2012-11-23 VITALS — BP 140/88 | Wt 162.0 lb

## 2012-11-23 DIAGNOSIS — N898 Other specified noninflammatory disorders of vagina: Secondary | ICD-10-CM

## 2012-11-23 DIAGNOSIS — A599 Trichomoniasis, unspecified: Secondary | ICD-10-CM | POA: Insufficient documentation

## 2012-11-23 LAB — POCT WET PREP (WET MOUNT): Clue Cells Wet Prep Whiff POC: POSITIVE

## 2012-11-23 MED ORDER — METRONIDAZOLE 250 MG PO TABS: 2000.0000 mg | ORAL_TABLET | Freq: Once | ORAL | Status: AC

## 2012-11-23 MED ORDER — METRONIDAZOLE 250 MG PO TABS: 500.0000 mg | ORAL_TABLET | Freq: Once | ORAL | Status: DC

## 2012-11-23 NOTE — Progress Notes (Signed)
Wet prep r/o trich.  Trichomonas seen on pap BP 140/88  Wt 162 lb (73.483 kg)  LMP 11/07/2011 Physical Examination: General appearance - alert, well appearing, and in no distress Abdomen - soft, nontender, nondistended, no masses or organomegaly Pelvic - normal external genitalia, vulva, vagina, cervix, uterus and adnexa, WET MOUNT done - results: trichomonads Flagyl for trichomonas. OSOM positive for trichomaonas.  I did not see it on wet prep

## 2012-12-21 ENCOUNTER — Ambulatory Visit: Payer: BC Managed Care – PPO

## 2013-01-04 ENCOUNTER — Ambulatory Visit
Admission: RE | Admit: 2013-01-04 | Discharge: 2013-01-04 | Disposition: A | Discharge status: Home / Self Care | Payer: BC Managed Care – PPO | Source: Ambulatory Visit | Attending: Obstetrics and Gynecology | Admitting: Obstetrics and Gynecology

## 2013-01-04 DIAGNOSIS — Z1231 Encounter for screening mammogram for malignant neoplasm of breast: Secondary | ICD-10-CM

## 2014-08-21 ENCOUNTER — Encounter: Payer: Self-pay | Admitting: Obstetrics and Gynecology

## 2017-10-19 ENCOUNTER — Emergency Department (HOSPITAL_COMMUNITY)
Admission: EM | Admit: 2017-10-19 | Discharge: 2017-10-19 | Disposition: A | Discharge status: Home / Self Care | Payer: BLUE CROSS/BLUE SHIELD | Attending: Emergency Medicine | Admitting: Emergency Medicine

## 2017-10-19 ENCOUNTER — Encounter (HOSPITAL_COMMUNITY): Payer: Self-pay

## 2017-10-19 DIAGNOSIS — Y998 Other external cause status: Secondary | ICD-10-CM | POA: Insufficient documentation

## 2017-10-19 DIAGNOSIS — R600 Localized edema: Secondary | ICD-10-CM | POA: Diagnosis not present

## 2017-10-19 DIAGNOSIS — Y9289 Other specified places as the place of occurrence of the external cause: Secondary | ICD-10-CM | POA: Diagnosis not present

## 2017-10-19 DIAGNOSIS — S40861A Insect bite (nonvenomous) of right upper arm, initial encounter: Secondary | ICD-10-CM | POA: Diagnosis not present

## 2017-10-19 DIAGNOSIS — R21 Rash and other nonspecific skin eruption: Secondary | ICD-10-CM | POA: Insufficient documentation

## 2017-10-19 DIAGNOSIS — W57XXXA Bitten or stung by nonvenomous insect and other nonvenomous arthropods, initial encounter: Secondary | ICD-10-CM | POA: Insufficient documentation

## 2017-10-19 DIAGNOSIS — F17210 Nicotine dependence, cigarettes, uncomplicated: Secondary | ICD-10-CM | POA: Diagnosis not present

## 2017-10-19 DIAGNOSIS — S40862A Insect bite (nonvenomous) of left upper arm, initial encounter: Secondary | ICD-10-CM | POA: Insufficient documentation

## 2017-10-19 DIAGNOSIS — Y9389 Activity, other specified: Secondary | ICD-10-CM | POA: Insufficient documentation

## 2017-10-19 DIAGNOSIS — L299 Pruritus, unspecified: Secondary | ICD-10-CM | POA: Insufficient documentation

## 2017-10-19 MED ORDER — PREDNISONE 20 MG PO TABS: 40.0000 mg | ORAL_TABLET | Freq: Every day | ORAL | 0 refills | Status: DC

## 2017-10-19 NOTE — ED Triage Notes (Signed)
Pt reports insect bites to both arms.  Areas are red and c/o itching and pain.

## 2017-10-19 NOTE — ED Provider Notes (Signed)
[facility] Provider Note   CSN: [id number] Arrival date & time: 10/19/17  2671     History   Chief Complaint Chief Complaint  Patient presents with  . Insect Bite    HPI Alexandria Little is a 51 y.o. female.  HPI  Alexandria Little is a 51 y.o. female who presents to the Emergency Department complaining of redness, pain and itching to both upper arms.  Concerned that she has been bitten by some type of insect.  States that she woke up one day prior and noticed a single, red, swollen area to her right upper arm and left forearm.  Complains of constant itching and "soreness" to touch.  Denies fever, chills, numbness or weakness of the extremities or rash.  She took one benadryl yesterday with temporary relief of itching.  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     History reviewed. No pertinent past medical history.  Patient Active Problem List   Diagnosis Date Noted  . Trichimoniasis 11/23/2012  . S/P laparoscopic hysterectomy 11/26/2011    Past Surgical History:  Procedure Laterality Date  . CYSTOSCOPY  11/25/2011   Procedure: CYSTOSCOPY;  Surgeon: Betsy Coder, MD;  Location: [facility];  Service: Gynecology;  Laterality: Bilateral;  . DIAGNOSTIC LAPAROSCOPY    . LAPAROSCOPIC HYSTERECTOMY  11/25/2011   Procedure: HYSTERECTOMY TOTAL LAPAROSCOPIC;  Surgeon: Betsy Coder, MD;  Location: [facility];  Service: Gynecology;  Laterality: N/A;  . TUBAL LIGATION      OB History    Gravida Para Term Preterm AB Living   3 2           SAB TAB Ectopic Multiple Live Births                   Home Medications    Prior to Admission medications   Medication Sig Start Date End Date Taking? Authorizing Provider  aspirin 325 MG tablet Take 325 mg by mouth daily.    [provider]  naproxen sodium (ANAPROX) 220 MG tablet Take 220 mg by mouth 2 (two) times daily with a meal.     [provider]    Family History Family History  Problem Relation Age of Onset  . Hypertension Mother   . Heart disease Mother   . Hypertension Father   . Hypertension Brother   . Diabetes Brother     Social History Social History   Tobacco Use  . Smoking status: Current Every Day Smoker  . Smokeless tobacco: Never Used  Substance Use Topics  . Alcohol use: No  . Drug use: No     Allergies   Patient has no known allergies.   Review of Systems Review of Systems  Constitutional: Negative for chills, fatigue and fever.  Gastrointestinal: Negative for nausea and vomiting.  Musculoskeletal: Negative for arthralgias, joint swelling and myalgias.  Skin: Positive for color change.       Redness, itching and swelling both arms  Neurological: Negative for weakness and numbness.  Hematological: Negative for adenopathy.  All other systems reviewed and are negative.    Physical Exam Updated Vital Signs BP (!) 135/93 (BP Location: Left Arm)   Pulse 70   Temp 98.2 F (36.8 C) (Oral)   Resp 18   Ht 5' (1.524 m)   Wt 74.8 kg (165 lb)   LMP 11/07/2011   SpO2 100%   BMI 32.22 kg/m  Physical Exam  Constitutional: She is oriented to person, place, and time. She appears well-developed and well-nourished. No distress.  HENT:  Head: Normocephalic and atraumatic.  Mouth/Throat: Oropharynx is clear and moist.  Neck: Normal range of motion. Neck supple.  Cardiovascular: Normal rate, regular rhythm and intact distal pulses.  No murmur heard. Radial pulses strong and symmetrical   Pulmonary/Chest: Effort normal and breath sounds normal. No respiratory distress.  Musculoskeletal: Normal range of motion. She exhibits no edema or tenderness.  Lymphadenopathy:    She has no cervical adenopathy.  Neurological: She is alert and oriented to person, place, and time. No sensory deficit. She exhibits normal muscle tone. Coordination normal.  Skin: Skin is warm. Capillary  refill takes less than 2 seconds. Rash noted. There is erythema.  6 cm focal area of erythema to the distal right upper arm and a 3 cm similar appearing area to the left mid forearm.  No fluctuance, pustule or vesicle.    Psychiatric: She has a normal mood and affect.  Nursing note and vitals reviewed.    ED Treatments / Results  Labs (all labs ordered are listed, but only abnormal results are displayed) Labs Reviewed - No data to display  EKG  EKG Interpretation None       Radiology No results found.  Procedures Procedures (including critical care time)  Medications Ordered in ED Medications - No data to display   Initial Impression / Assessment and Plan / ED Course  I have reviewed the triage vital signs and the nursing notes.  Pertinent labs & imaging results that were available during my care of the patient were reviewed by me and considered in my medical decision making (see chart for details).     Pt well appearing.  NV intact.  FROM of bilateral UE's.  Symptoms are similar appearing to each arm and localized.  No concerning sx's for abscess, doubtful cellulitis.    Final Clinical Impressions(s) / ED Diagnoses   Final diagnoses:  Insect bite, initial encounter    ED Discharge Orders    None       Kem Parkinson, PA-C 10/19/17 0825    Isla Pence, MD 10/19/17 1011

## 2017-10-19 NOTE — Discharge Instructions (Signed)
Continue taking benadryl one capsule every 4-6 hrs for at least the next 2-3 days.  Apply hydrocortisone 1% cream twice a day.  Cool compresses on/off.  Return here for any worsening symptoms such as fever, red streaks,  or formation of a blister or pustule.

## 2018-08-25 ENCOUNTER — Emergency Department (HOSPITAL_COMMUNITY)
Admission: EM | Admit: 2018-08-25 | Discharge: 2018-08-25 | Disposition: A | Discharge status: Home / Self Care | Payer: BLUE CROSS/BLUE SHIELD | Attending: Emergency Medicine | Admitting: Emergency Medicine

## 2018-08-25 ENCOUNTER — Emergency Department (HOSPITAL_COMMUNITY): Payer: BLUE CROSS/BLUE SHIELD

## 2018-08-25 ENCOUNTER — Other Ambulatory Visit: Payer: Self-pay

## 2018-08-25 ENCOUNTER — Encounter (HOSPITAL_COMMUNITY): Payer: Self-pay | Admitting: *Deleted

## 2018-08-25 DIAGNOSIS — R079 Chest pain, unspecified: Secondary | ICD-10-CM | POA: Diagnosis present

## 2018-08-25 DIAGNOSIS — I10 Essential (primary) hypertension: Secondary | ICD-10-CM | POA: Insufficient documentation

## 2018-08-25 DIAGNOSIS — Z79899 Other long term (current) drug therapy: Secondary | ICD-10-CM | POA: Insufficient documentation

## 2018-08-25 DIAGNOSIS — F17210 Nicotine dependence, cigarettes, uncomplicated: Secondary | ICD-10-CM | POA: Diagnosis not present

## 2018-08-25 DIAGNOSIS — R0789 Other chest pain: Secondary | ICD-10-CM | POA: Insufficient documentation

## 2018-08-25 HISTORY — DX: Disorder of thyroid, unspecified: E07.9

## 2018-08-25 HISTORY — DX: Pure hypercholesterolemia, unspecified: E78.00

## 2018-08-25 HISTORY — DX: Essential (primary) hypertension: I10

## 2018-08-25 LAB — BASIC METABOLIC PANEL
Anion gap: 10 (ref 5–15)
BUN: 13 mg/dL (ref 6–20)
CO2: 22 mmol/L (ref 22–32)
CREATININE: 0.73 mg/dL (ref 0.44–1.00)
Calcium: 9.4 mg/dL (ref 8.9–10.3)
Chloride: 106 mmol/L (ref 98–111)
GFR calc Af Amer: >60 mL/min (ref >60)
Glucose, Bld: 92 mg/dL (ref 70–99)
Potassium: 3.9 mmol/L (ref 3.5–5.1)
SODIUM: 138 mmol/L (ref 135–145)

## 2018-08-25 LAB — DIFFERENTIAL
Basophils Absolute: 0.1 10*3/uL (ref 0.0–0.1)
Basophils Relative: 1 %
EOS ABS: 0.1 10*3/uL (ref 0.0–0.5)
Eosinophils Relative: 1 %
LYMPHS ABS: 2.6 10*3/uL (ref 0.7–4.0)
Lymphocytes Relative: 35 %
Monocytes Absolute: 0.5 10*3/uL (ref 0.1–1.0)
Monocytes Relative: 6 %
NEUTROS PCT: 57 %
Neutro Abs: 4.2 10*3/uL (ref 1.7–7.7)

## 2018-08-25 LAB — CBC
HCT: 45.2 % (ref 36.0–46.0)
Hemoglobin: 14 g/dL (ref 12.0–15.0)
MCH: 25 pg — ABNORMAL LOW (ref 26.0–34.0)
MCHC: 31 g/dL (ref 30.0–36.0)
MCV: 80.6 fL (ref 80.0–100.0)
Platelets: 314 10*3/uL (ref 150–400)
RBC: 5.61 MIL/uL — ABNORMAL HIGH (ref 3.87–5.11)
RDW: 13 % (ref 11.5–15.5)
WBC: 7.4 10*3/uL (ref 4.0–10.5)
nRBC: 0 % (ref 0.0–0.2)

## 2018-08-25 LAB — HEPATIC FUNCTION PANEL
ALBUMIN: 4.8 g/dL (ref 3.5–5.0)
ALK PHOS: 85 U/L (ref 38–126)
ALT: 15 U/L (ref 0–44)
AST: 23 U/L (ref 15–41)
BILIRUBIN TOTAL: 0.6 mg/dL (ref 0.3–1.2)
Bilirubin, Direct: 0.1 mg/dL (ref 0.0–0.2)
Indirect Bilirubin: 0.5 mg/dL (ref 0.3–0.9)
Total Protein: 9 g/dL — ABNORMAL HIGH (ref 6.5–8.1)

## 2018-08-25 LAB — TROPONIN I
Troponin I: <0.03 ng/mL (ref <0.03)
Troponin I: <0.03 ng/mL (ref <0.03)

## 2018-08-25 LAB — LIPASE, BLOOD: Lipase: 33 U/L (ref 11–51)

## 2018-08-25 MED ORDER — PANTOPRAZOLE SODIUM 20 MG PO TBEC: 20.0000 mg | DELAYED_RELEASE_TABLET | Freq: Every day | ORAL | 0 refills | Status: AC

## 2018-08-25 NOTE — ED Provider Notes (Signed)
[facility] EMERGENCY DEPARTMENT Provider Note   CSN: [id number] Arrival date & time: 08/25/18  1746     History   Chief Complaint Chief Complaint  Patient presents with  . Chest Pain    HPI Alexandria Little is a 52 y.o. female.  Patient states that she has been having chest pain today that starts her epigastric area and goes superiorly.  The history is provided by the patient. No language interpreter was used.  Chest Pain   This is a new problem. The current episode started 12 to 24 hours ago. The problem occurs hourly. The problem has not changed since onset.The pain is associated with rest. The pain is present in the substernal region. The pain is at a severity of 4/10. The pain is mild. The quality of the pain is described as burning. Radiates to: Upper chest. Exacerbated by: Unknown. Pertinent negatives include no abdominal pain, no back pain, no cough and no headaches.  Pertinent negatives for past medical history include no seizures.    Past Medical History:  Diagnosis Date  . High cholesterol   . Hypertension   . Thyroid disease     Patient Active Problem List   Diagnosis Date Noted  . Trichimoniasis 11/23/2012  . S/P laparoscopic hysterectomy 11/26/2011    Past Surgical History:  Procedure Laterality Date  . CYSTOSCOPY  11/25/2011   Procedure: CYSTOSCOPY;  Surgeon: Betsy Coder, MD;  Location: [facility];  Service: Gynecology;  Laterality: Bilateral;  . DIAGNOSTIC LAPAROSCOPY    . LAPAROSCOPIC HYSTERECTOMY  11/25/2011   Procedure: HYSTERECTOMY TOTAL LAPAROSCOPIC;  Surgeon: Betsy Coder, MD;  Location: [facility];  Service: Gynecology;  Laterality: N/A;  . TUBAL LIGATION       OB History    Gravida  3   Para  2   Term      Preterm      AB      Living        SAB      TAB      Ectopic      Multiple      Live Births               Home Medications    Prior to Admission medications   Medication Sig Start Date End Date Taking?  Authorizing Provider  amLODipine-benazepril (LOTREL) 5-20 MG capsule Take 1 capsule by mouth every morning. 07/20/18  Yes [provider]  aspirin 81 MG tablet Take 81 mg by mouth every morning.    Yes [provider]  atorvastatin (LIPITOR) 40 MG tablet Take 40 mg by mouth at bedtime. 12/22/17  Yes [provider]  levothyroxine (SYNTHROID, LEVOTHROID) 25 MCG tablet Take 25 mcg by mouth every morning. 12/22/17  Yes [provider]  pantoprazole (PROTONIX) 20 MG tablet Take 1 tablet (20 mg total) by mouth daily. 08/25/18   Milton Ferguson, MD    Family History Family History  Problem Relation Age of Onset  . Hypertension Mother   . Heart disease Mother   . Hypertension Father   . Hypertension Brother   . Diabetes Brother     Social History Social History   Tobacco Use  . Smoking status: Current Every Day Smoker    Types: Cigarettes  . Smokeless tobacco: Never Used  Substance Use Topics  . Alcohol use: No  . Drug use: No     Allergies   Patient has no known allergies.   Review of Systems Review  of Systems  Constitutional: Negative for appetite change and fatigue.  HENT: Negative for congestion, ear discharge and sinus pressure.   Eyes: Negative for discharge.  Respiratory: Negative for cough.   Cardiovascular: Positive for chest pain.  Gastrointestinal: Negative for abdominal pain and diarrhea.  Genitourinary: Negative for frequency and hematuria.  Musculoskeletal: Negative for back pain.  Skin: Negative for rash.  Neurological: Negative for seizures and headaches.  Psychiatric/Behavioral: Negative for hallucinations.     Physical Exam Updated Vital Signs BP 101/72   Pulse 95   Temp 98.2 F (36.8 C) (Oral)   Resp 17   Ht 5' (1.524 m)   Wt 74.8 kg   LMP 11/07/2011   SpO2 98%   BMI 32.22 kg/m   Physical Exam  Constitutional: She is oriented to person, place, and time. She appears well-developed.  HENT:  Head: Normocephalic.   Eyes: Conjunctivae and EOM are normal. No scleral icterus.  Neck: Neck supple. No thyromegaly present.  Cardiovascular: Normal rate and regular rhythm. Exam reveals no gallop and no friction rub.  No murmur heard. Pulmonary/Chest: No stridor. She has no wheezes. She has no rales. She exhibits no tenderness.  Abdominal: She exhibits no distension. There is no tenderness. There is no rebound.  Musculoskeletal: Normal range of motion. She exhibits no edema.  Lymphadenopathy:    She has no cervical adenopathy.  Neurological: She is oriented to person, place, and time. She exhibits normal muscle tone. Coordination normal.  Skin: No rash noted. No erythema.  Psychiatric: She has a normal mood and affect. Her behavior is normal.     ED Treatments / Results  Labs (all labs ordered are listed, but only abnormal results are displayed) Labs Reviewed  CBC - Abnormal; Notable for the following components:      Result Value   RBC 5.61 (*)    MCH 25.0 (*)    All other components within normal limits  HEPATIC FUNCTION PANEL - Abnormal; Notable for the following components:   Total Protein 9.0 (*)    All other components within normal limits  BASIC METABOLIC PANEL  TROPONIN I  LIPASE, BLOOD  DIFFERENTIAL  TROPONIN I    EKG EKG Interpretation  Date/Time:  Wednesday August 25 2018 17:56:30 EST Ventricular Rate:  82 PR Interval:    QRS Duration: 75 QT Interval:  381 QTC Calculation: 445 R Axis:   30 Text Interpretation:  Sinus rhythm Low voltage, precordial leads Confirmed by Milton Ferguson [phone]) on 08/25/2018 7:35:11 PM   Radiology Dg Chest 2 View  Result Date: 08/25/2018 CLINICAL DATA:  Intermittent chest pain and shortness of breath today. History of diabetes and smoking. EXAM: CHEST - 2 VIEW COMPARISON:  Chest radiograph April 11, 2015 FINDINGS: Cardiomediastinal silhouette is normal. No pleural effusions or focal consolidations. Mild chronic bronchitic changes. Trachea projects  midline and there is no pneumothorax. Soft tissue planes and included osseous structures are non-suspicious. IMPRESSION: Mild chronic bronchitic changes without focal consolidation. Electronically Signed   By: Elon Alas M.D.   On: 08/25/2018 18:35    Procedures Procedures (including critical care time)  Medications Ordered in ED Medications - No data to display   Initial Impression / Assessment and Plan / ED Course  I have reviewed the triage vital signs and the nursing notes.  Pertinent labs & imaging results that were available during my care of the patient were reviewed by me and considered in my medical decision making (see chart for details).  EKG chest x-ray labs unremarkable including 2 troponins.  Patient will be placed on Protonix and will be referred to cardiology for possible stress test considering she does have a significant family history and risk factors  Final Clinical Impressions(s) / ED Diagnoses   Final diagnoses:  Atypical chest pain    ED Discharge Orders         Ordered    pantoprazole (PROTONIX) 20 MG tablet  Daily     08/25/18 2133           Milton Ferguson, MD 08/25/18 2136

## 2018-08-25 NOTE — ED Notes (Signed)
Denies pain at present, intermittent all day, with SOB

## 2018-08-25 NOTE — Discharge Instructions (Signed)
Follow-up with Dr. Bronson Ing or 1 of his partners in the next couple weeks to see if you may need further evaluation of your chest pain.  Return if any problems

## 2018-08-25 NOTE — ED Triage Notes (Signed)
Pt with mid cp all day, denies sob or N/V.  When called pt to room, pt eating fruit snacks.

## 2018-11-05 ENCOUNTER — Ambulatory Visit: Payer: BLUE CROSS/BLUE SHIELD | Admitting: Cardiovascular Disease

## 2019-01-04 IMAGING — DX DG CHEST 2V
2 series · 2 of 2 positions shown · non-contrast
Comparison: Chest radiograph April 11, 2015

CLINICAL DATA: Intermittent chest pain and shortness of breath
today. History of diabetes and smoking.

EXAM:
CHEST - 2 VIEW

[chest pa]
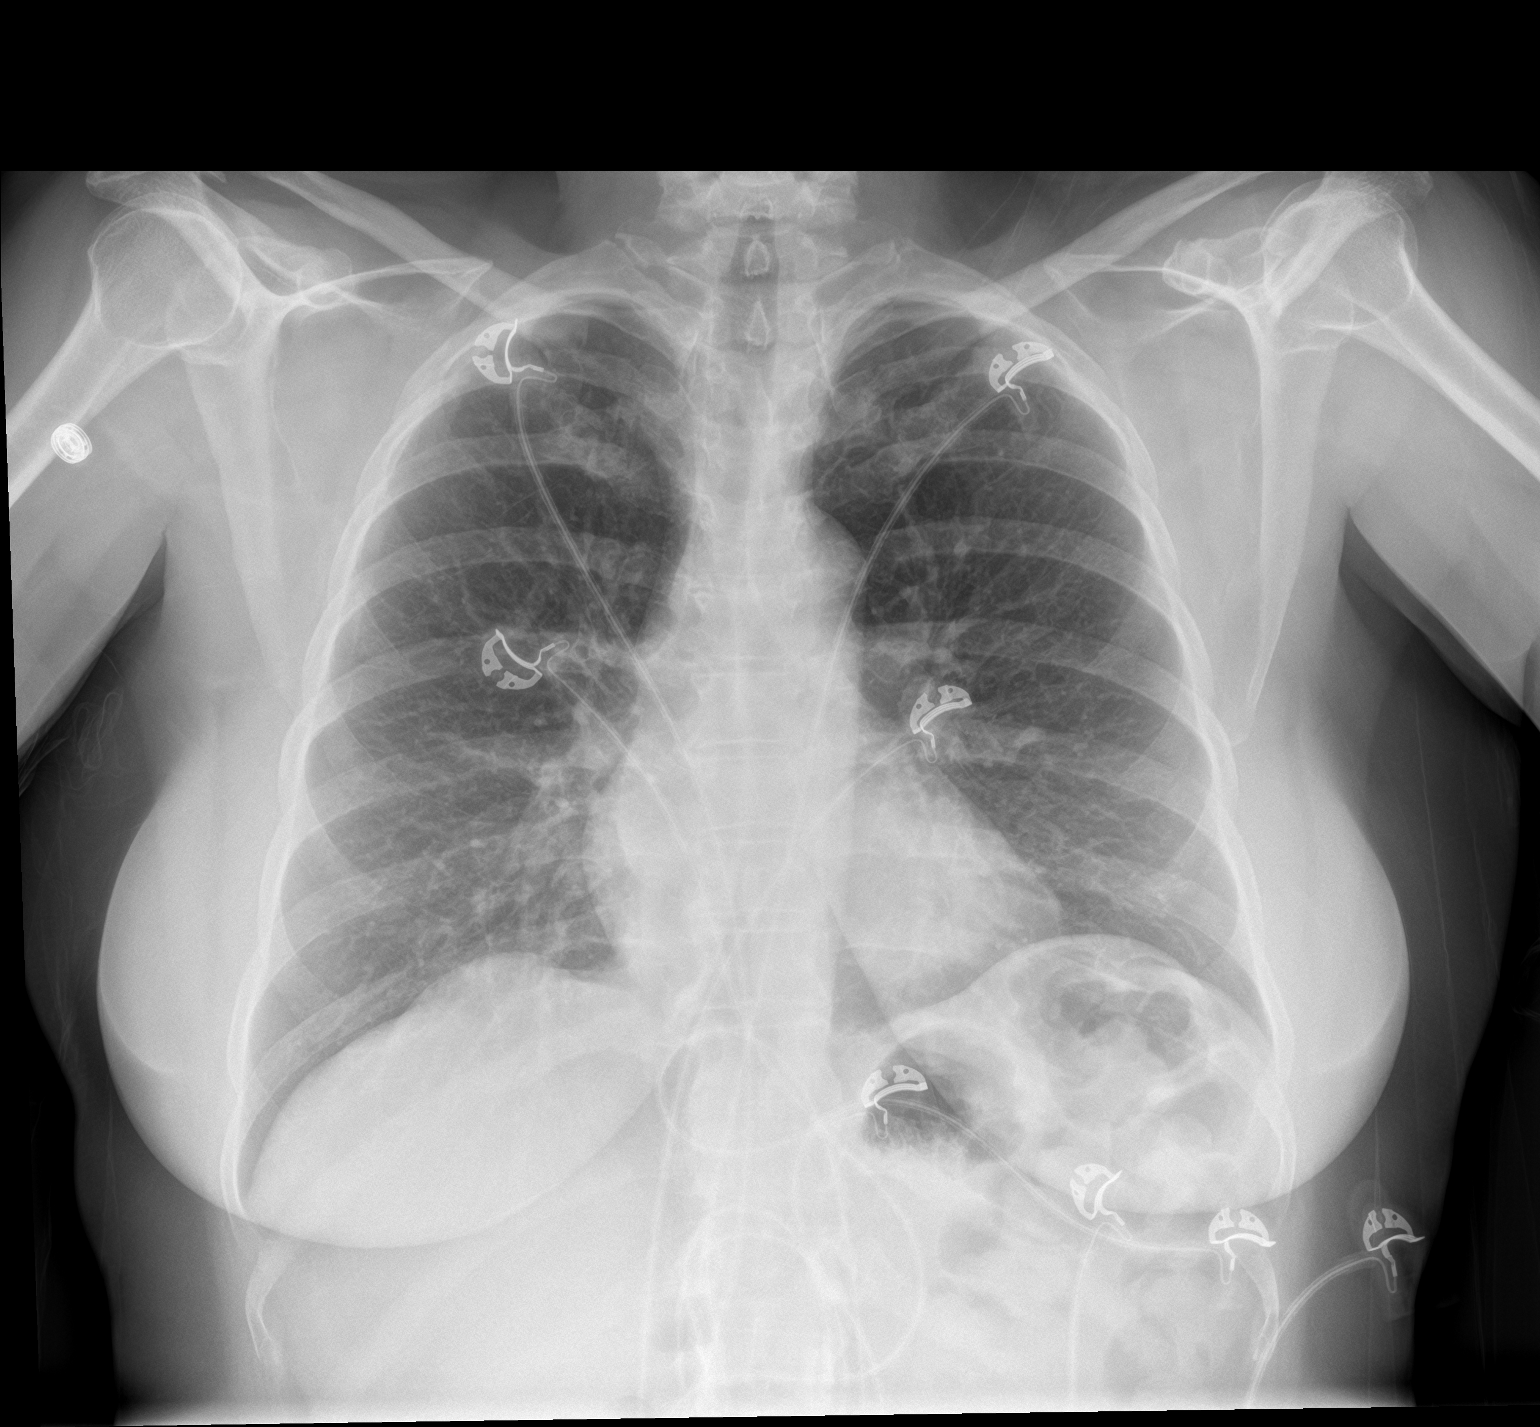

[chest lat]
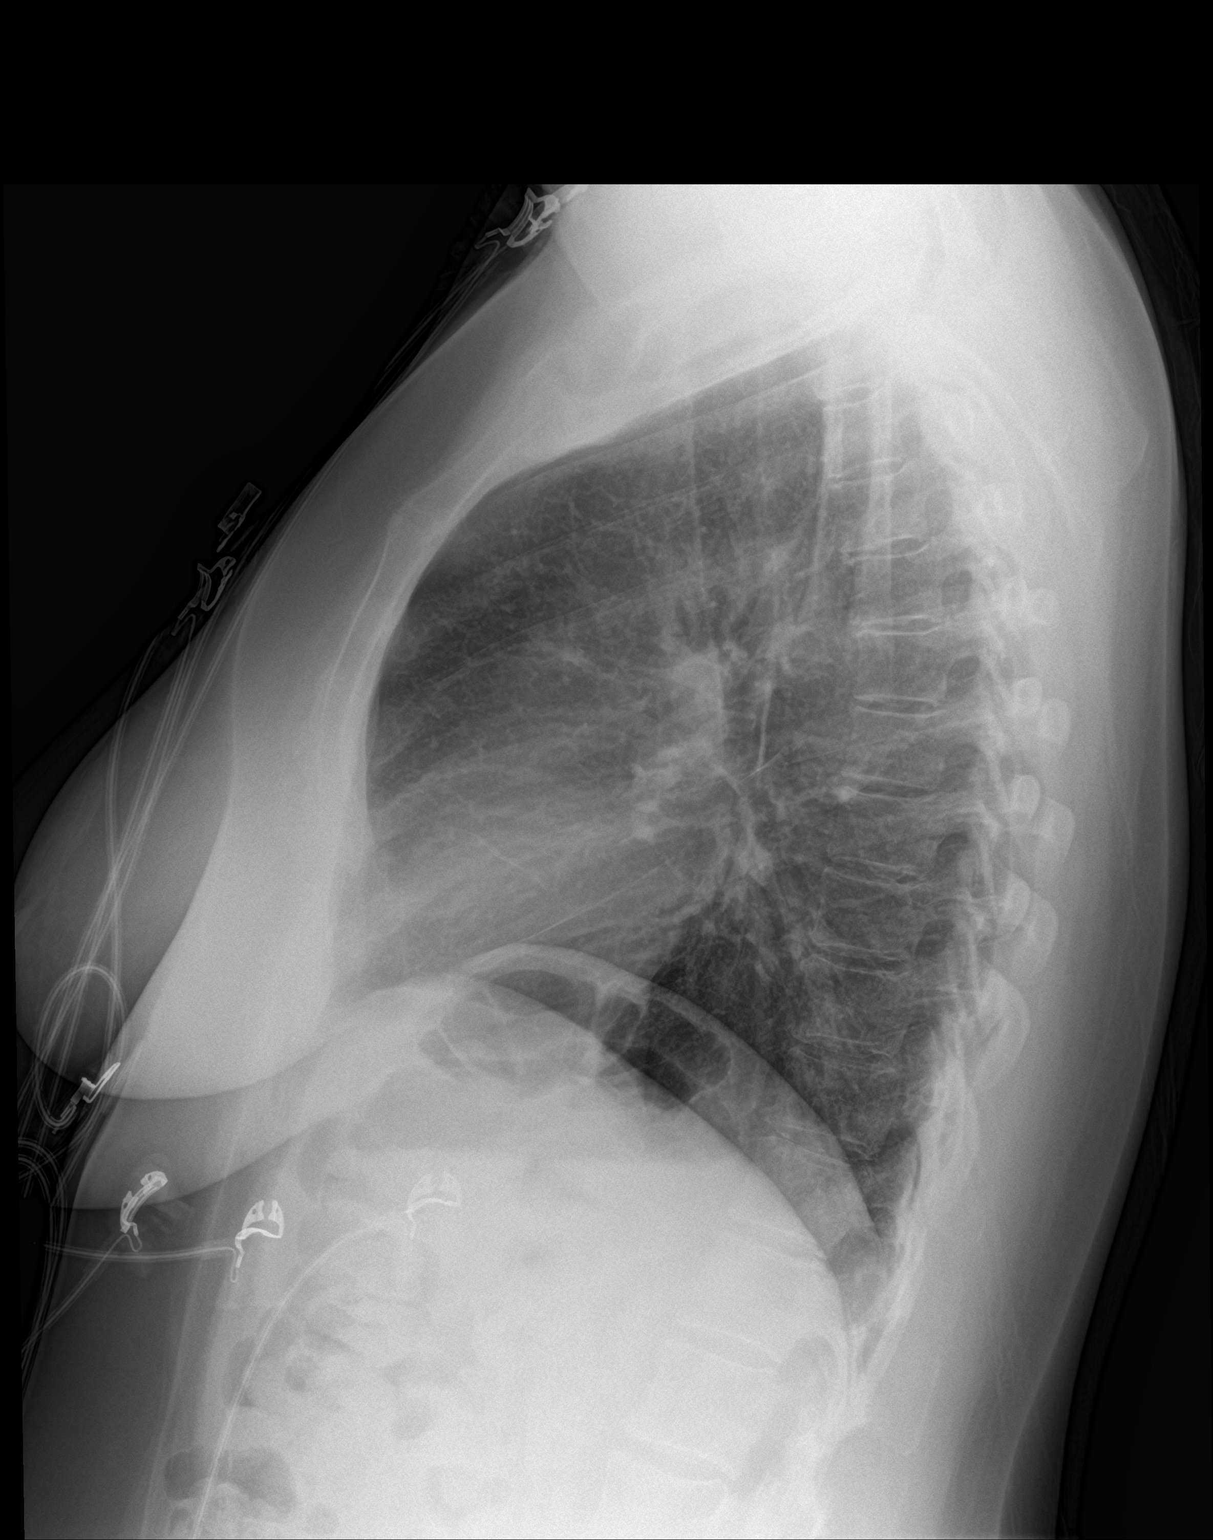

[2 of 2 positions shown; findings below may reference images not displayed]

FINDINGS: Cardiomediastinal silhouette is normal. No pleural effusions or
focal consolidations. Mild chronic bronchitic changes. Trachea
projects midline and there is no pneumothorax. Soft tissue planes
and included osseous structures are non-suspicious.
IMPRESSION: Mild chronic bronchitic changes without focal consolidation.

## 2019-12-07 ENCOUNTER — Other Ambulatory Visit: Payer: Self-pay | Admitting: Physician Assistant

## 2019-12-07 DIAGNOSIS — Z1231 Encounter for screening mammogram for malignant neoplasm of breast: Secondary | ICD-10-CM

## 2020-01-13 ENCOUNTER — Other Ambulatory Visit: Payer: Self-pay

## 2020-01-13 ENCOUNTER — Ambulatory Visit
Admission: RE | Admit: 2020-01-13 | Discharge: 2020-01-13 | Disposition: A | Discharge status: Home / Self Care | Payer: BC Managed Care – PPO | Source: Ambulatory Visit | Attending: Physician Assistant | Admitting: Physician Assistant

## 2020-01-13 DIAGNOSIS — Z1231 Encounter for screening mammogram for malignant neoplasm of breast: Secondary | ICD-10-CM

## 2020-01-26 ENCOUNTER — Ambulatory Visit: Payer: BC Managed Care – PPO | Attending: Internal Medicine

## 2020-01-26 DIAGNOSIS — Z23 Encounter for immunization: Secondary | ICD-10-CM

## 2020-01-26 NOTE — Progress Notes (Signed)
   Covid-19 Vaccination Clinic  Name:  Alexandria Little    MRN: [id number] DOB: 01-03-1966  01/26/2020  Ms. Sock was observed post Covid-19 immunization for 15 minutes without incident. She was provided with Vaccine Information Sheet and instruction to access the V-Safe system.   [name] was instructed to call 911 with any severe reactions post vaccine: Marland Kitchen Difficulty breathing  . Swelling of face and throat  . A fast heartbeat  . A bad rash all over body  . Dizziness and weakness   Immunizations Administered    Name Date Dose VIS Date Route   Moderna COVID-19 Vaccine 01/26/2020 10:09 AM 0.5 mL 09/20/2019 Intramuscular   Manufacturer: Moderna   L[place]: [id number]   [id number]

## 2020-02-23 ENCOUNTER — Ambulatory Visit: Payer: BC Managed Care – PPO | Attending: Internal Medicine

## 2020-02-23 DIAGNOSIS — Z23 Encounter for immunization: Secondary | ICD-10-CM

## 2020-02-23 NOTE — Progress Notes (Signed)
   Covid-19 Vaccination Clinic  Name:  Alexandria Little    MRN: [id number] DOB: July 10, 1966  02/23/2020  [name] was observed post Covid-19 immunization for 15 minutes without incident. She was provided with Vaccine Information Sheet and instruction to access the V-Safe system.   [name] was instructed to call 911 with any severe reactions post vaccine: Marland Kitchen Difficulty breathing  . Swelling of face and throat  . A fast heartbeat  . A bad rash all over body  . Dizziness and weakness   Immunizations Administered    Name Date Dose VIS Date Route   Moderna COVID-19 Vaccine 02/23/2020 10:06 AM 0.5 mL 09/2019 Intramuscular   Manufacturer: Moderna   Lot: [id number]   [id number]

## 2020-03-27 ENCOUNTER — Emergency Department (HOSPITAL_COMMUNITY)
Admission: EM | Admit: 2020-03-27 | Discharge: 2020-03-27 | Disposition: A | Discharge status: Home / Self Care | Payer: BC Managed Care – PPO | Attending: Emergency Medicine | Admitting: Emergency Medicine

## 2020-03-27 ENCOUNTER — Emergency Department (HOSPITAL_COMMUNITY): Payer: BC Managed Care – PPO

## 2020-03-27 ENCOUNTER — Other Ambulatory Visit: Payer: Self-pay

## 2020-03-27 ENCOUNTER — Encounter (HOSPITAL_COMMUNITY): Payer: Self-pay | Admitting: Emergency Medicine

## 2020-03-27 DIAGNOSIS — I10 Essential (primary) hypertension: Secondary | ICD-10-CM | POA: Diagnosis not present

## 2020-03-27 DIAGNOSIS — M25561 Pain in right knee: Secondary | ICD-10-CM | POA: Diagnosis present

## 2020-03-27 DIAGNOSIS — Z87891 Personal history of nicotine dependence: Secondary | ICD-10-CM | POA: Insufficient documentation

## 2020-03-27 DIAGNOSIS — Z7982 Long term (current) use of aspirin: Secondary | ICD-10-CM | POA: Insufficient documentation

## 2020-03-27 DIAGNOSIS — Z79899 Other long term (current) drug therapy: Secondary | ICD-10-CM | POA: Insufficient documentation

## 2020-03-27 MED ORDER — NAPROXEN 500 MG PO TABS: 500.0000 mg | ORAL_TABLET | Freq: Two times a day (BID) | ORAL | 0 refills | Status: AC

## 2020-03-27 NOTE — ED Triage Notes (Signed)
Pt c/o RT knee pain and swelling x 3 weeks. Denies injury. Pt ambulatory.

## 2020-03-27 NOTE — ED Provider Notes (Signed)
[facility] Provider Note   CSN: [id number] Arrival date & time: 03/27/20  1029     History Chief Complaint  Patient presents with  . Knee Pain    Alexandria Little is a 54 y.o. female.  HPI      Alexandria Little is a 54 y.o. female who presents to the Emergency Department complaining of pain and swelling of the anterior right knee.  Symptoms have been present for 3 weeks.  She reports standing and walking all day during her job and believes this has aggravated her knee pain.  She denies known injury.  She describes a dull pain to the upper portion of the kneecap that radiates to the back of her knee.  Pain is worse with standing, walking and flexion of the knee.  Pain improves at rest.  She has been trying over-the-counter medications without relief.  She denies pain radiating to her calf or thigh, numbness or tingling, redness or wounds or rash.    Past Medical History:  Diagnosis Date  . High cholesterol   . Hypertension   . Thyroid disease     Patient Active Problem List   Diagnosis Date Noted  . Trichimoniasis 11/23/2012  . S/P laparoscopic hysterectomy 11/26/2011    Past Surgical History:  Procedure Laterality Date  . CYSTOSCOPY  11/25/2011   Procedure: CYSTOSCOPY;  Surgeon: Betsy Coder, MD;  Location: [facility];  Service: Gynecology;  Laterality: Bilateral;  . DIAGNOSTIC LAPAROSCOPY    . LAPAROSCOPIC HYSTERECTOMY  11/25/2011   Procedure: HYSTERECTOMY TOTAL LAPAROSCOPIC;  Surgeon: Betsy Coder, MD;  Location: [facility];  Service: Gynecology;  Laterality: N/A;  . TUBAL LIGATION       OB History    Gravida  3   Para  2   Term      Preterm      AB      Living        SAB      TAB      Ectopic      Multiple      Live Births              Family History  Problem Relation Age of Onset  . Hypertension Mother   . Heart disease Mother   . Hypertension Father   . Hypertension Brother   . Diabetes Brother     Social  History   Tobacco Use  . Smoking status: Former Smoker    Types: Cigarettes  . Smokeless tobacco: Never Used  . Tobacco comment: Quit four months ago  Substance Use Topics  . Alcohol use: No  . Drug use: No    Home Medications Prior to Admission medications   Medication Sig Start Date End Date Taking? Authorizing Provider  amLODipine-benazepril (LOTREL) 5-20 MG capsule Take 1 capsule by mouth every morning. 07/20/18   [provider]  aspirin 81 MG tablet Take 81 mg by mouth every morning.     [provider]  atorvastatin (LIPITOR) 40 MG tablet Take 40 mg by mouth at bedtime. 12/22/17   [provider]  levothyroxine (SYNTHROID, LEVOTHROID) 25 MCG tablet Take 25 mcg by mouth every morning. 12/22/17   [provider]  pantoprazole (PROTONIX) 20 MG tablet Take 1 tablet (20 mg total) by mouth daily. 08/25/18   Milton Ferguson, MD    Allergies    Patient has no known allergies.  Review of Systems   Review of Systems  Constitutional: Negative  for chills and fever.  Respiratory: Negative for shortness of breath.   Cardiovascular: Negative for chest pain.  Gastrointestinal: Negative for abdominal pain, nausea and vomiting.  Musculoskeletal: Positive for arthralgias (right knee pain and swelling) and joint swelling.  Skin: Negative for color change, rash and wound.  Neurological: Negative for dizziness, weakness and numbness.    Physical Exam Updated Vital Signs BP (!) 155/86 (BP Location: Right Arm)   Pulse 72   Temp 98.1 F (36.7 C) (Oral)   Resp 16   Ht 5' (1.524 m)   Wt 78.5 kg   LMP 11/07/2011   SpO2 99%   BMI 33.79 kg/m   Physical Exam Vitals and nursing note reviewed.  Constitutional:      Appearance: Normal appearance. She is not ill-appearing.  HENT:     Mouth/Throat:     Mouth: Mucous membranes are moist.  Cardiovascular:     Rate and Rhythm: Normal rate and regular rhythm.     Pulses: Normal pulses.  Pulmonary:     Effort:  Pulmonary effort is normal.     Breath sounds: Normal breath sounds.  Chest:     Chest wall: No tenderness.  Musculoskeletal:        General: Swelling and tenderness present. No deformity or signs of injury.     Cervical back: Normal range of motion.     Comments: Tenderness to palpation of the anterior right knee, lateral to the superior patella.  Mild to moderate edema noted.  No excessive warmth or erythema.  No palpable effusion.  Negative valgus and varus stress.  Skin:    General: Skin is warm.     Capillary Refill: Capillary refill takes less than 2 seconds.     Findings: No erythema or rash.  Neurological:     General: No focal deficit present.     Mental Status: She is alert.     Sensory: No sensory deficit.     Motor: No weakness.     ED Results / Procedures / Treatments   Labs (all labs ordered are listed, but only abnormal results are displayed) Labs Reviewed - No data to display  EKG None  Radiology DG Knee Complete 4 Views Right  Result Date: 03/27/2020 CLINICAL DATA:  Pain and swelling EXAM: RIGHT KNEE - COMPLETE 4+ VIEW COMPARISON:  None. FINDINGS: Frontal, lateral, and bilateral oblique views were obtained. No fracture, dislocation, or joint effusion. There is joint space narrowing medially. Other joint spaces appear unremarkable. There is a spur arising from the anterior superior patella. No erosive change. IMPRESSION: Joint space narrowing medially. Other joint spaces appear unremarkable. No fracture, dislocation, or effusion. Spur along the anterior superior patella likely represents distal quadriceps tendinosis. Electronically Signed   By: Lowella Grip III M.D.   On: 03/27/2020 11:25    Procedures Procedures (including critical care time)  Medications Ordered in ED Medications - No data to display  ED Course  I have reviewed the triage vital signs and the nursing notes.  Pertinent labs & imaging results that were available during my care of the  patient were reviewed by me and considered in my medical decision making (see chart for details).    MDM Rules/Calculators/A&P                      Patient here with 3-week history of pain and swelling of the right knee.  No known injury.  She is neurovascularly intact.  No erythema or excessive  warmth to suggest septic joint.  X-ray of the right knee obtained and interpreted by me.  Patient has focal tenderness along the anterior superior patella and x-ray shows spurring to this area.  Symptoms likely related to tendinosis.  Patient is neurovascularly intact.  Knee brace applied, patient agrees to ice, elevation, and nonsteroidal.  No history of renal insufficiency.  She has a orthopedic provider and agrees to arrange follow-up appointment.   Final Clinical Impression(s) / ED Diagnoses Final diagnoses:  Acute pain of right knee    Rx / DC Orders ED Discharge Orders    None       Bufford Lope 03/27/20 1237    Dorie Rank, MD 03/27/20 [phone]

## 2020-03-27 NOTE — Discharge Instructions (Signed)
The x-ray of your knee today shows a spur along the top portion of your kneecap.  This is likely the source of your symptoms.  Elevate and apply ice packs on and off to your knee when possible.  Wear the brace for support when walking or standing.  Call your orthopedic provider to arrange follow-up appointment in 1 week if not improving.

## 2020-10-25 ENCOUNTER — Emergency Department (HOSPITAL_COMMUNITY)
Admission: EM | Admit: 2020-10-25 | Discharge: 2020-10-25 | Disposition: A | Discharge status: Home / Self Care | Payer: BC Managed Care – PPO | Attending: Emergency Medicine | Admitting: Emergency Medicine

## 2020-10-25 ENCOUNTER — Encounter (HOSPITAL_COMMUNITY): Payer: Self-pay | Admitting: Emergency Medicine

## 2020-10-25 ENCOUNTER — Other Ambulatory Visit: Payer: Self-pay

## 2020-10-25 DIAGNOSIS — Z79899 Other long term (current) drug therapy: Secondary | ICD-10-CM | POA: Insufficient documentation

## 2020-10-25 DIAGNOSIS — Z20822 Contact with and (suspected) exposure to covid-19: Secondary | ICD-10-CM | POA: Diagnosis not present

## 2020-10-25 DIAGNOSIS — Z87891 Personal history of nicotine dependence: Secondary | ICD-10-CM | POA: Insufficient documentation

## 2020-10-25 DIAGNOSIS — R059 Cough, unspecified: Secondary | ICD-10-CM | POA: Diagnosis present

## 2020-10-25 DIAGNOSIS — E039 Hypothyroidism, unspecified: Secondary | ICD-10-CM | POA: Insufficient documentation

## 2020-10-25 DIAGNOSIS — Z90710 Acquired absence of both cervix and uterus: Secondary | ICD-10-CM | POA: Insufficient documentation

## 2020-10-25 DIAGNOSIS — B349 Viral infection, unspecified: Secondary | ICD-10-CM | POA: Diagnosis not present

## 2020-10-25 DIAGNOSIS — I10 Essential (primary) hypertension: Secondary | ICD-10-CM | POA: Diagnosis not present

## 2020-10-25 LAB — SARS CORONAVIRUS 2 (TAT 6-24 HRS): SARS Coronavirus 2: NEGATIVE

## 2020-10-25 LAB — POC SARS CORONAVIRUS 2 AG -  ED: SARS Coronavirus 2 Ag: NEGATIVE

## 2020-10-25 NOTE — ED Provider Notes (Signed)
[facility] EMERGENCY DEPARTMENT Provider Note   CSN: [id number] Arrival date & time: 10/25/20  0932     History Chief Complaint  Patient presents with  . Sore Throat    Alexandria Little is a 55 y.o. female presented emergency department concern for COVID exposure.  The patient reports that her husband tested positive for COVID earlier this week.  She herself began having a cough, congestion, low-grade fever, headache, myalgia, for the past 3 days or so.  She reports her workplace asked that she come get tested for COVID prior to returning.  She denies any significant shortness of breath.  She reports she did receive 2 doses of the COVID-vaccine as well as a booster.  HPI     Past Medical History:  Diagnosis Date  . High cholesterol   . Hypertension   . Thyroid disease     Patient Active Problem List   Diagnosis Date Noted  . Trichimoniasis 11/23/2012  . S/P laparoscopic hysterectomy 11/26/2011    Past Surgical History:  Procedure Laterality Date  . CYSTOSCOPY  11/25/2011   Procedure: CYSTOSCOPY;  Surgeon: Michael Litter, MD;  Location: [facility];  Service: Gynecology;  Laterality: Bilateral;  . DIAGNOSTIC LAPAROSCOPY    . LAPAROSCOPIC HYSTERECTOMY  11/25/2011   Procedure: HYSTERECTOMY TOTAL LAPAROSCOPIC;  Surgeon: Michael Litter, MD;  Location: [facility];  Service: Gynecology;  Laterality: N/A;  . TUBAL LIGATION       OB History    Gravida  3   Para  2   Term      Preterm      AB      Living        SAB      IAB      Ectopic      Multiple      Live Births              Family History  Problem Relation Age of Onset  . Hypertension Mother   . Heart disease Mother   . Hypertension Father   . Hypertension Brother   . Diabetes Brother     Social History   Tobacco Use  . Smoking status: Former Smoker    Types: Cigarettes  . Smokeless tobacco: Never Used  . Tobacco comment: Quit four months ago  Vaping Use  . Vaping Use: Every day   Substance Use Topics  . Alcohol use: No  . Drug use: No    Home Medications Prior to Admission medications   Medication Sig Start Date End Date Taking? Authorizing Provider  amLODipine-benazepril (LOTREL) 5-20 MG capsule Take 1 capsule by mouth every morning. 07/20/18   [provider]  aspirin 81 MG tablet Take 81 mg by mouth every morning.     [provider]  atorvastatin (LIPITOR) 40 MG tablet Take 40 mg by mouth at bedtime. 12/22/17   [provider]  levothyroxine (SYNTHROID, LEVOTHROID) 25 MCG tablet Take 25 mcg by mouth every morning. 12/22/17   [provider]  naproxen (NAPROSYN) 500 MG tablet Take 1 tablet (500 mg total) by mouth 2 (two) times daily with a meal. 03/27/20   Triplett, Tammy, PA-C  pantoprazole (PROTONIX) 20 MG tablet Take 1 tablet (20 mg total) by mouth daily. 08/25/18   Bethann Berkshire, MD    Allergies    Patient has no known allergies.  Review of Systems   Review of Systems  Constitutional: Positive for appetite change, chills, fatigue and fever.  HENT:  Positive for congestion and sore throat.   Eyes: Negative for pain and visual disturbance.  Respiratory: Positive for cough. Negative for shortness of breath.   Cardiovascular: Negative for chest pain and palpitations.  Gastrointestinal: Positive for nausea. Negative for abdominal pain.  Genitourinary: Negative for dysuria and hematuria.  Musculoskeletal: Positive for arthralgias and myalgias.  Skin: Negative for color change and rash.  Neurological: Positive for headaches. Negative for syncope.  All other systems reviewed and are negative.   Physical Exam Updated Vital Signs BP (!) 149/90 (BP Location: Right Arm)   Pulse 79   Temp 98.3 F (36.8 C) (Oral)   Resp 18   Ht 5' (1.524 m)   Wt 78.5 kg   LMP 11/07/2011   SpO2 97%   BMI 33.79 kg/m   Physical Exam Vitals and nursing note reviewed.  Constitutional:      General: She is not in acute distress.     Appearance: She is well-developed and well-nourished.  HENT:     Head: Normocephalic and atraumatic.  Eyes:     Conjunctiva/sclera: Conjunctivae normal.  Cardiovascular:     Rate and Rhythm: Normal rate and regular rhythm.  Pulmonary:     Effort: Pulmonary effort is normal. No respiratory distress.  Musculoskeletal:        General: No edema.     Cervical back: Neck supple.  Skin:    General: Skin is warm and dry.  Neurological:     General: No focal deficit present.     Mental Status: She is alert and oriented to person, place, and time.  Psychiatric:        Mood and Affect: Mood and affect normal.     ED Results / Procedures / Treatments   Labs (all labs ordered are listed, but only abnormal results are displayed) Labs Reviewed  SARS CORONAVIRUS 2 (TAT 6-24 HRS)  POC SARS CORONAVIRUS 2 AG -  ED    EKG None  Radiology No results found.  Procedures Procedures (including critical care time)  Medications Ordered in ED Medications - No data to display  ED Course  I have reviewed the triage vital signs and the nursing notes.  Pertinent labs & imaging results that were available during my care of the patient were reviewed by me and considered in my medical decision making (see chart for details).  55 year old female here with viral type syndrome for the past 3 days.  Husband is positive for COVID at home.  Point-of-care test was negative today, however I have a high clinical suspicion this may still be COVID.  We will send a send out PCR test 12-24 hours.  I advised that she quarantine for 10 days if her test returns positive.  We discussed symptomatic management at home.  She has no hypoxia or evidence of acute respiratory distress at this time to warrant further emergent work-up or x-ray imaging.  Alexandria Little was evaluated in Emergency Department on 10/25/2020 for the symptoms described in the history of present illness. She was evaluated in the context of the global  COVID-19 pandemic, which necessitated consideration that the patient might be at risk for infection with the SARS-CoV-2 virus that causes COVID-19. Institutional protocols and algorithms that pertain to the evaluation of patients at risk for COVID-19 are in a state of rapid change based on information released by regulatory bodies including the CDC and federal and state organizations. These policies and algorithms were followed during the patient's care in the ED.  Final Clinical Impression(s) / ED Diagnoses Final diagnoses:  Viral syndrome    Rx / DC Orders ED Discharge Orders    None       Wyvonnia Dusky, MD 10/25/20 1219

## 2020-10-25 NOTE — Discharge Instructions (Addendum)
Discharge Instructions and Work Note  We swabbed you for Covid today.  Your covid swab will take up to 24 hours to result.  We recommend that you quarantine at home in the meantime.  If you test POSITIVE for Covid, you should continue to quarantine for 10 days from the onset of your symptoms.  Please bring this paper to work as proof of your ED visit today.  You can check for your results online on your patient portal tonight and tomorrow.

## 2020-10-25 NOTE — ED Triage Notes (Signed)
Pt reports sore throat, chills, intermittent low grade fever, runny nose, cough for last several days. Pt reports was exposed to someone with covid. Pt has received both moderna shots.

## 2021-06-26 ENCOUNTER — Other Ambulatory Visit: Payer: Self-pay | Admitting: Physician Assistant

## 2021-06-26 DIAGNOSIS — Z1231 Encounter for screening mammogram for malignant neoplasm of breast: Secondary | ICD-10-CM

## 2021-07-01 ENCOUNTER — Ambulatory Visit
Admission: RE | Admit: 2021-07-01 | Discharge: 2021-07-01 | Disposition: A | Discharge status: Home / Self Care | Payer: BC Managed Care – PPO | Source: Ambulatory Visit | Attending: Physician Assistant | Admitting: Physician Assistant

## 2021-07-01 ENCOUNTER — Other Ambulatory Visit: Payer: Self-pay

## 2021-07-01 DIAGNOSIS — Z1231 Encounter for screening mammogram for malignant neoplasm of breast: Secondary | ICD-10-CM

## 2021-11-10 IMAGING — MG MM DIGITAL SCREENING BILAT W/ TOMO AND CAD
8 series · 8 of 24 positions shown · non-contrast
Comparison: Previous exam(s).

CLINICAL DATA: Screening.

EXAM:
DIGITAL SCREENING BILATERAL MAMMOGRAM WITH TOMOSYNTHESIS AND CAD
TECHNIQUE: Bilateral screening digital craniocaudal and mediolateral oblique
mammograms were obtained. Bilateral screening digital breast
tomosynthesis was performed. The images were evaluated with
computer-aided detection.

[R MLO synth-2D]
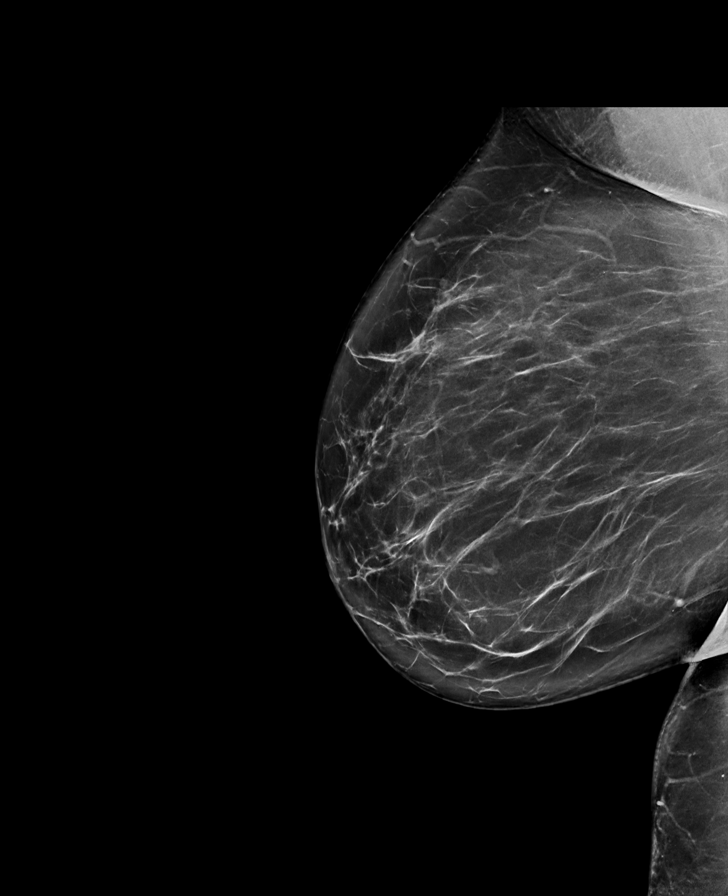

[L CC synth-2D]
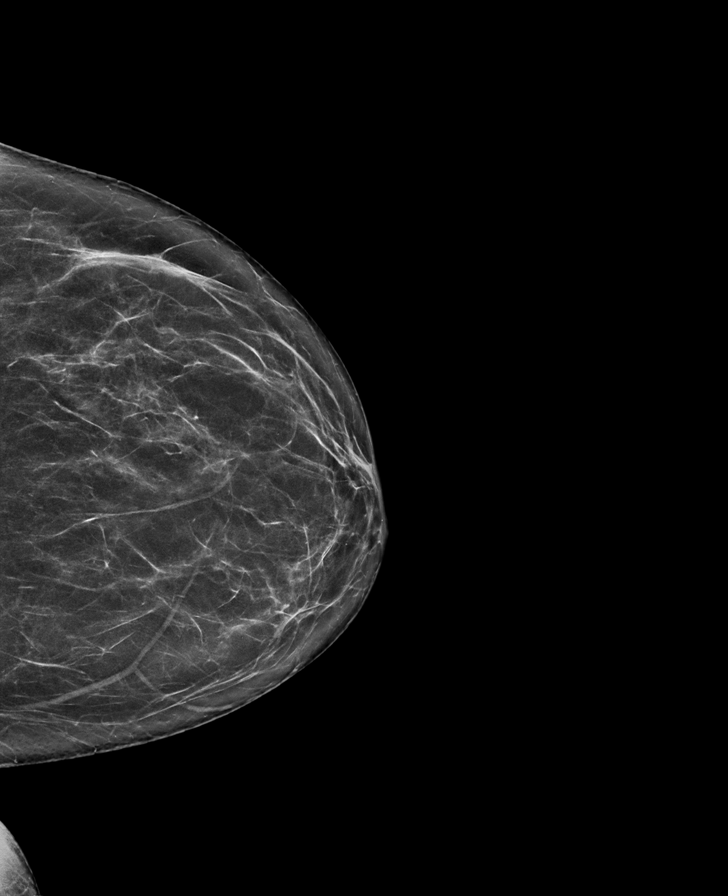

[R CC synth-2D]
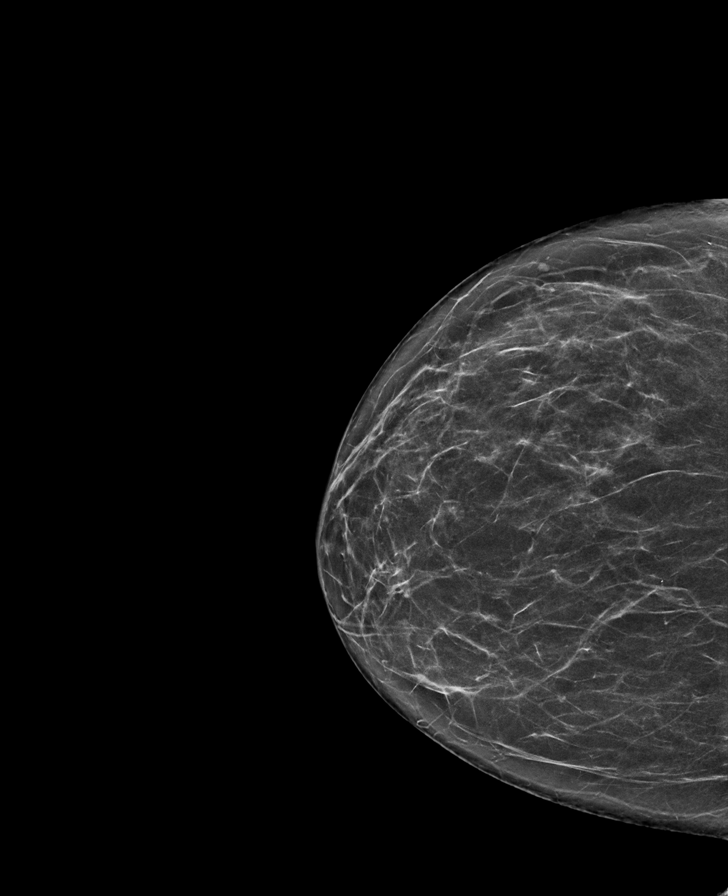

[L MLO synth-2D]
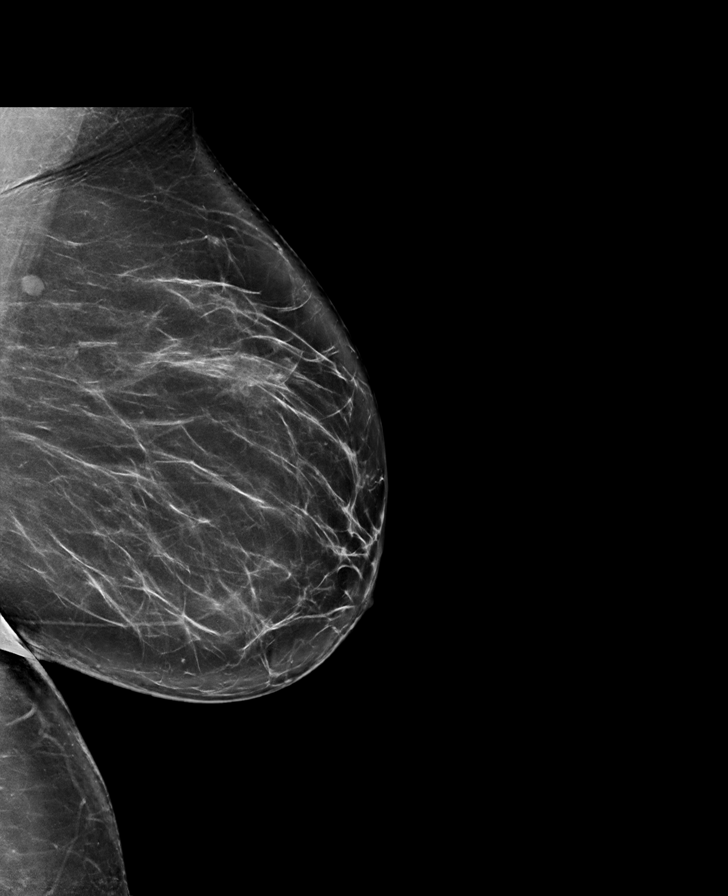

[L MLO tomo · tomo slice 43/84.0]
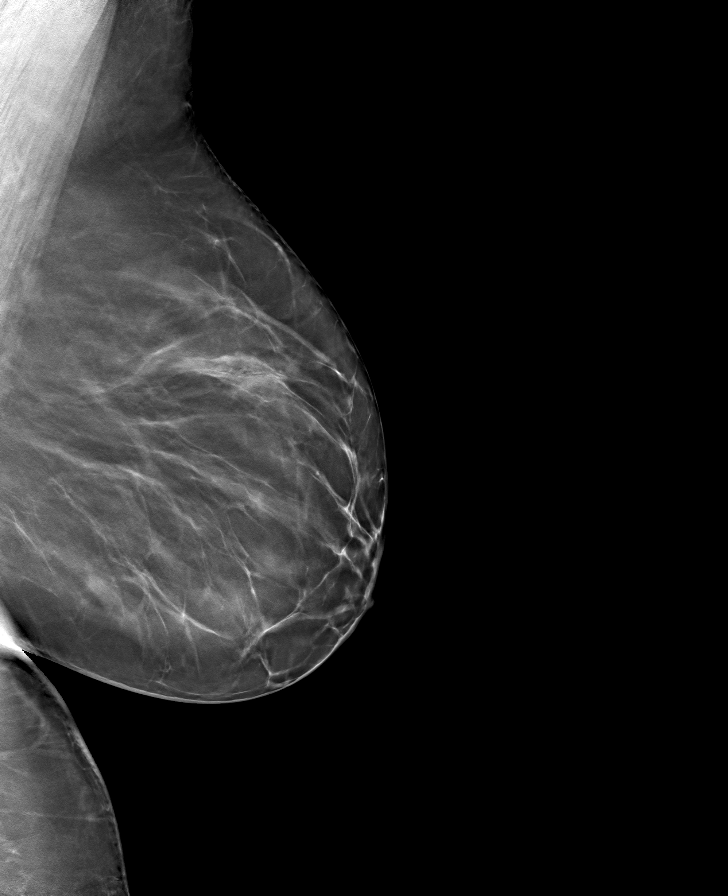

[R MLO tomo · tomo slice 45/88.0]
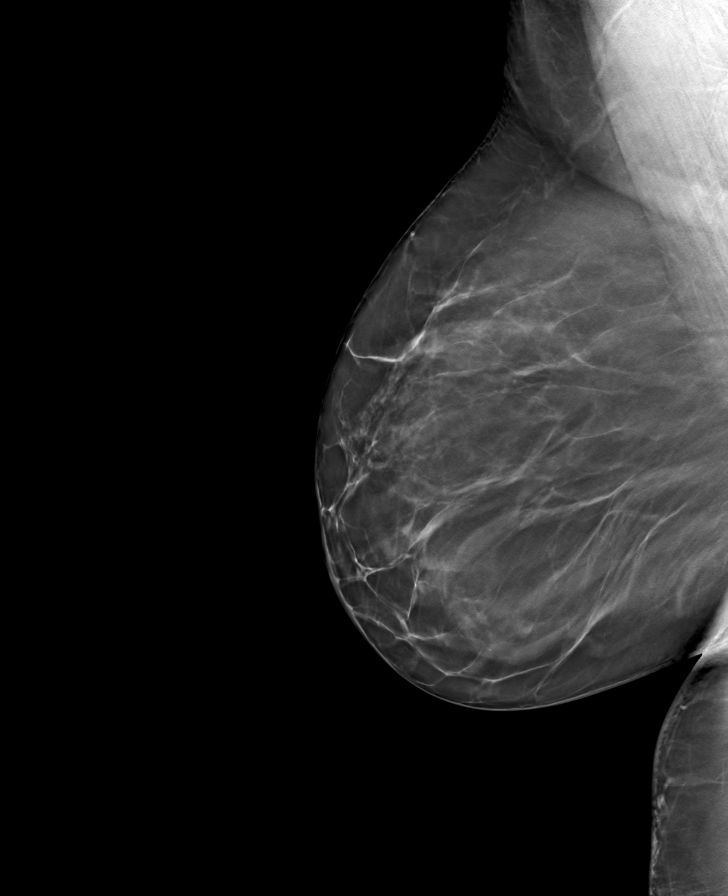

[L CC tomo · tomo slice 35/70.0]
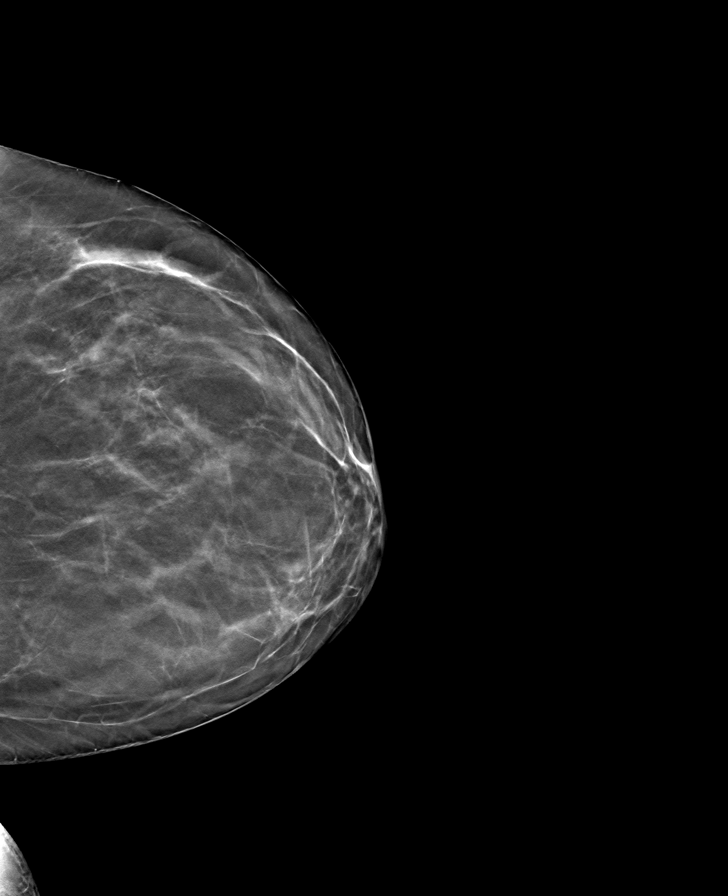

[R CC tomo · tomo slice 37/72.0]
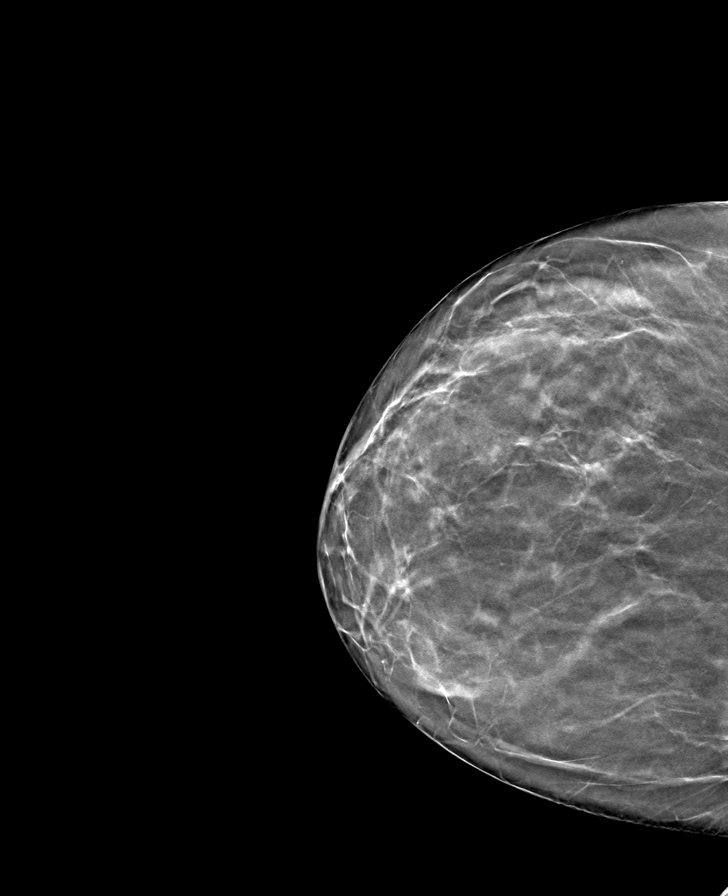

[8 of 24 positions shown; findings below may reference images not displayed]

ACR Breast Density Category b: There are scattered areas of
fibroglandular density.
FINDINGS: There are no findings suspicious for malignancy.
IMPRESSION: No mammographic evidence of malignancy. A result letter of this
screening mammogram will be mailed directly to the patient.

RECOMMENDATION:
Screening mammogram in one year. (Code:51-O-LD2)

BI-RADS CATEGORY  1: Negative.

## 2022-07-01 ENCOUNTER — Encounter: Payer: Self-pay | Admitting: Gastroenterology

## 2022-07-18 ENCOUNTER — Ambulatory Visit (AMBULATORY_SURGERY_CENTER): Payer: Self-pay | Admitting: *Deleted

## 2022-07-18 VITALS — Ht 60.0 in | Wt 166.4 lb

## 2022-07-18 DIAGNOSIS — Z1211 Encounter for screening for malignant neoplasm of colon: Secondary | ICD-10-CM

## 2022-07-18 MED ORDER — NA SULFATE-K SULFATE-MG SULF 17.5-3.13-1.6 GM/177ML PO SOLN: 1.0000 | Freq: Once | ORAL | 0 refills | Status: AC

## 2022-07-18 NOTE — Progress Notes (Signed)
No egg or soy allergy known to patient  No issues known to pt with past sedation with any surgeries or procedures Patient denies ever being told they had issues or difficulty with intubation  No FH of Malignant Hyperthermia Pt is not on diet pills Pt is not on  home 02  Pt is not on blood thinners  Pt denies issues with constipation  Pt encouraged to use to use Singlecare or Goodrx to reduce cost  

## 2022-07-31 ENCOUNTER — Encounter: Payer: Self-pay | Admitting: Gastroenterology

## 2022-08-08 ENCOUNTER — Telehealth: Payer: Self-pay | Admitting: Gastroenterology

## 2022-08-08 ENCOUNTER — Ambulatory Visit (AMBULATORY_SURGERY_CENTER): Payer: BC Managed Care – PPO | Admitting: Gastroenterology

## 2022-08-08 ENCOUNTER — Encounter: Payer: Self-pay | Admitting: Gastroenterology

## 2022-08-08 VITALS — BP 127/84 | HR 77 | Temp 97.5°F | Resp 23 | Ht 60.0 in | Wt 166.4 lb

## 2022-08-08 DIAGNOSIS — Z1211 Encounter for screening for malignant neoplasm of colon: Secondary | ICD-10-CM

## 2022-08-08 MED ORDER — SODIUM CHLORIDE 0.9 % IV SOLN: 500.0000 mL | Freq: Once | INTRAVENOUS | Status: DC

## 2022-08-08 NOTE — Op Note (Signed)
[facility] Patient Name: Alexandria Little Procedure Date: 08/08/2022 1:57 PM MRN: [id number] Endoscopist: Nicki Reaper E. Candis Schatz , MD Age: 56 Referring MD:  Date of Birth: 01/16/1966 Gender: Female Account #: [id number] Procedure:                Colonoscopy Indications:              Screening for colorectal malignant neoplasm, This                            is the patient's first colonoscopy Medicines:                Monitored Anesthesia Care Procedure:                Pre-Anesthesia Assessment:                           - Prior to the procedure, a History and Physical                            was performed, and patient medications and                            allergies were reviewed. The patient's tolerance of                            previous anesthesia was also reviewed. The risks                            and benefits of the procedure and the sedation                            options and risks were discussed with the patient.                            All questions were answered, and informed consent                            was obtained. Prior Anticoagulants: The patient has                            taken no previous anticoagulant or antiplatelet                            agents. ASA Grade Assessment: II - A patient with                            mild systemic disease. After reviewing the risks                            and benefits, the patient was deemed in                            satisfactory condition to undergo the procedure.  After obtaining informed consent, the colonoscope                            was passed under direct vision. Throughout the                            procedure, the patient's blood pressure, pulse, and                            oxygen saturations were monitored continuously. The                            Olympus CF-HQ190L [id number]) Colonoscope was                            introduced through the  anus and advanced to the the                            cecum, identified by appendiceal orifice and                            ileocecal valve. The colonoscopy was somewhat                            difficult due to a tortuous colon. Successful                            completion of the procedure was aided by changing                            the patient to a supine position and using manual                            pressure. The patient tolerated the procedure well.                            The quality of the bowel preparation was good. The                            ileocecal valve, appendiceal orifice, and rectum                            were photographed. The bowel preparation used was                            SUPREP via split dose instruction. Scope In: 2:05:50 PM Scope Out: 2:26:05 PM Scope Withdrawal Time: 0 hours 10 minutes 46 seconds  Total Procedure Duration: 0 hours 20 minutes 15 seconds  Findings:                 The perianal and digital rectal examinations were                            normal. Pertinent negatives include normal  sphincter tone and no palpable rectal lesions.                           The colon (entire examined portion) appeared                            normal. Retroflexion was attempted in the rectum                            but was unsuccessful due to narrowed rectal vault. Complications:            No immediate complications. Estimated Blood Loss:     Estimated blood loss: none. Impression:               - The entire examined colon is normal.                           - No specimens collected. Recommendation:           - Patient has a contact number available for                            emergencies. The signs and symptoms of potential                            delayed complications were discussed with the                            patient. Return to normal activities tomorrow.                             Written discharge instructions were provided to the                            patient.                           - Resume previous diet.                           - Continue present medications.                           - Repeat colonoscopy in 10 years for screening                            purposes. Tangala Wiegert E. Candis Schatz, MD 08/08/2022 2:31:39 PM This report has been signed electronically.

## 2022-08-08 NOTE — Patient Instructions (Signed)
Resume previous diet and medications. Repeat Colonoscopy in 10 years for screening.  YOU HAD AN ENDOSCOPIC PROCEDURE TODAY AT [facility]:   Refer to the procedure report that was given to you for any specific questions about what was found during the examination.  If the procedure report does not answer your questions, please call your gastroenterologist to clarify.  If you requested that your care partner not be given the details of your procedure findings, then the procedure report has been included in a sealed envelope for you to review at your convenience later.  YOU SHOULD EXPECT: Some feelings of bloating in the abdomen. Passage of more gas than usual.  Walking can help get rid of the air that was put into your GI tract during the procedure and reduce the bloating. If you had a lower endoscopy (such as a colonoscopy or flexible sigmoidoscopy) you may notice spotting of blood in your stool or on the toilet paper. If you underwent a bowel prep for your procedure, you may not have a normal bowel movement for a few days.  Please Note:  You might notice some irritation and congestion in your nose or some drainage.  This is from the oxygen used during your procedure.  There is no need for concern and it should clear up in a day or so.  SYMPTOMS TO REPORT IMMEDIATELY:  Following lower endoscopy (colonoscopy or flexible sigmoidoscopy):  Excessive amounts of blood in the stool  Significant tenderness or worsening of abdominal pains  Swelling of the abdomen that is new, acute  Fever of 100F or higher  For urgent or emergent issues, a gastroenterologist can be reached at any hour by calling [phone]. Do not use MyChart messaging for urgent concerns.    DIET:  We do recommend a small meal at first, but then you may proceed to your regular diet.  Drink plenty of fluids but you should avoid alcoholic beverages for 24 hours.  ACTIVITY:  You should plan to take it easy for the  rest of today and you should NOT DRIVE or use heavy machinery until tomorrow (because of the sedation medicines used during the test).    FOLLOW UP: Our staff will call the number listed on your records the next business day following your procedure.  We will call around 7:15- 8:00 am to check on you and address any questions or concerns that you may have regarding the information given to you following your procedure. If we do not reach you, we will leave a message.     If any biopsies were taken you will be contacted by phone or by letter within the next 1-3 weeks.  Please call us at [phone] if you have not heard about the biopsies in 3 weeks.    SIGNATURES/CONFIDENTIALITY: You and/or your care partner have signed paperwork which will be entered into your electronic medical record.  These signatures attest to the fact that that the information above on your After Visit Summary has been reviewed and is understood.  Full responsibility of the confidentiality of this discharge information lies with you and/or your care-partner.

## 2022-08-08 NOTE — Progress Notes (Signed)
Sedate, gd SR, tolerated procedure well, VSS, report to RN 

## 2022-08-08 NOTE — Progress Notes (Signed)
[facility] History and Physical   Primary Care Physician:  [facility]   Reason for Procedure:   Colon cancer screening   Plan:    Screening colonoscopy     HPI: Alexandria Little is a 56 y.o. female undergoing initial average risk screening colonoscopy.  She has no family history of colon cancer and no chronic GI symptoms.    Past Medical History:  Diagnosis Date   GERD (gastroesophageal reflux disease)    High cholesterol    Hypertension    Thyroid disease     Past Surgical History:  Procedure Laterality Date   CYSTOSCOPY  11/25/2011   Procedure: CYSTOSCOPY;  Surgeon: Betsy Coder, MD;  Location: [facility];  Service: Gynecology;  Laterality: Bilateral;   DIAGNOSTIC LAPAROSCOPY     LAPAROSCOPIC HYSTERECTOMY  11/25/2011   Procedure: HYSTERECTOMY TOTAL LAPAROSCOPIC;  Surgeon: Betsy Coder, MD;  Location: [facility];  Service: Gynecology;  Laterality: N/A;   TUBAL LIGATION      Prior to Admission medications   Medication Sig Start Date End Date Taking? Authorizing Provider  amLODipine-benazepril (LOTREL) 5-20 MG capsule Take 1 capsule by mouth every morning. 07/20/18  Yes [provider]  aspirin 81 MG tablet Take 81 mg by mouth every morning.    Yes [provider]  atorvastatin (LIPITOR) 40 MG tablet Take 40 mg by mouth at bedtime. 12/22/17  Yes [provider]  levothyroxine (SYNTHROID, LEVOTHROID) 25 MCG tablet Take 25 mcg by mouth every morning. 12/22/17  Yes [provider]  naproxen (NAPROSYN) 500 MG tablet Take 1 tablet (500 mg total) by mouth 2 (two) times daily with a meal. 03/27/20   Triplett, Tammy, PA-C  pantoprazole (PROTONIX) 20 MG tablet Take 1 tablet (20 mg total) by mouth daily. Patient taking differently: Take 20 mg by mouth daily. As needed 08/25/18   Milton Ferguson, MD    Current Outpatient Medications  Medication Sig Dispense Refill   amLODipine-benazepril (LOTREL) 5-20 MG capsule  Take 1 capsule by mouth every morning.     aspirin 81 MG tablet Take 81 mg by mouth every morning.      atorvastatin (LIPITOR) 40 MG tablet Take 40 mg by mouth at bedtime.     levothyroxine (SYNTHROID, LEVOTHROID) 25 MCG tablet Take 25 mcg by mouth every morning.     naproxen (NAPROSYN) 500 MG tablet Take 1 tablet (500 mg total) by mouth 2 (two) times daily with a meal. 14 tablet 0   pantoprazole (PROTONIX) 20 MG tablet Take 1 tablet (20 mg total) by mouth daily. (Patient taking differently: Take 20 mg by mouth daily. As needed) 30 tablet 0   Current Facility-Administered Medications  Medication Dose Route Frequency Provider Last Rate Last Admin   0.9 %  sodium chloride infusion  500 mL Intravenous Once Daryel November, MD        Allergies as of 08/08/2022   (No Known Allergies)    Family History  Problem Relation Age of Onset   Hypertension Mother    Heart disease Mother    Hypertension Father    Hypertension Brother    Diabetes Brother    Colon cancer Neg Hx        possibly paternal uncle had colon CA   Esophageal cancer Neg Hx    Rectal cancer Neg Hx    Stomach cancer Neg Hx     Social History   Socioeconomic History   Marital status: Legally Separated  Spouse name: Not on file   Number of children: Not on file   Years of education: Not on file   Highest education level: Not on file  Occupational History   Not on file  Tobacco Use   Smoking status: Former    Types: Cigarettes   Smokeless tobacco: Never   Tobacco comments:    Quit four months ago  Vaping Use   Vaping Use: Every day  Substance and Sexual Activity   Alcohol use: No   Drug use: No   Sexual activity: Never    Birth control/protection: Surgical  Other Topics Concern   Not on file  Social History Narrative   Not on file   Social Determinants of Health   Financial Resource Strain: Not on file  Food Insecurity: Not on file  Transportation Needs: Not on file  Physical Activity: Not on  file  Stress: Not on file  Social Connections: Not on file  Intimate Partner Violence: Not on file    Review of Systems:  All other review of systems negative except as mentioned in the HPI.  Physical Exam: Vital signs BP (!) 148/78   Pulse 77   Temp (!) 97.5 F (36.4 C) (Temporal)   Ht 5' (1.524 m)   Wt 166 lb 6.4 oz (75.5 kg)   LMP 11/07/2011   SpO2 98%   BMI 32.50 kg/m   General:   Alert,  Well-developed, well-nourished, pleasant and cooperative in NAD Airway:  Mallampati 2 Lungs:  Clear throughout to auscultation.   Heart:  Regular rate and rhythm; no murmurs, clicks, rubs,  or gallops. Abdomen:  Soft, nontender and nondistended. Normal bowel sounds.   Neuro/Psych:  Normal mood and affect. A and O x 3   Alexandria Little E. Candis Schatz, MD [facility]

## 2022-08-08 NOTE — Progress Notes (Signed)
Pt's states no medical or surgical changes since previsit or office visit. 

## 2022-08-11 ENCOUNTER — Telehealth: Payer: Self-pay

## 2022-08-11 NOTE — Telephone Encounter (Signed)
  Follow up Call-     08/08/2022    1:16 PM  Call back number  Post procedure Call Back phone  # [phone]  Permission to leave phone message Yes     Patient questions:  Do you have a fever, pain , or abdominal swelling? No. Pain Score  0 *  Have you tolerated food without any problems? Yes.    Have you been able to return to your normal activities? Yes.    Do you have any questions about your discharge instructions: Diet   No. Medications  No. Follow up visit  No.  Do you have questions or concerns about your Care? No.  Actions: * If pain score is 4 or above: No action needed, pain <4.
